# Patient Record
Sex: Female | Born: 1990 | Race: Black or African American | Hispanic: No | Marital: Single | Smoking: Never smoker
Health system: Southern US, Community
[De-identification: ages and names within clinical notes are randomized; demographics above are authoritative.]

## PROBLEM LIST (undated history)

## (undated) ENCOUNTER — Inpatient Hospital Stay (HOSPITAL_COMMUNITY): Payer: Self-pay

## (undated) DIAGNOSIS — D649 Anemia, unspecified: Secondary | ICD-10-CM

## (undated) DIAGNOSIS — B999 Unspecified infectious disease: Secondary | ICD-10-CM

## (undated) HISTORY — PX: DILATION AND CURETTAGE OF UTERUS: SHX78

---
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
## 2012-11-12 ENCOUNTER — Emergency Department (HOSPITAL_COMMUNITY)
Admission: EM | Admit: 2012-11-12 | Discharge: 2012-11-12 | Disposition: A | Discharge status: Home / Self Care | Payer: Medicaid Other | Attending: Emergency Medicine | Admitting: Emergency Medicine

## 2012-11-12 ENCOUNTER — Encounter (HOSPITAL_COMMUNITY): Payer: Self-pay | Admitting: Nurse Practitioner

## 2012-11-12 DIAGNOSIS — R5381 Other malaise: Secondary | ICD-10-CM | POA: Insufficient documentation

## 2012-11-12 DIAGNOSIS — J029 Acute pharyngitis, unspecified: Secondary | ICD-10-CM | POA: Insufficient documentation

## 2012-11-12 DIAGNOSIS — H109 Unspecified conjunctivitis: Secondary | ICD-10-CM | POA: Insufficient documentation

## 2012-11-12 LAB — RAPID STREP SCREEN (MED CTR MEBANE ONLY): Streptococcus, Group A Screen (Direct): NEGATIVE

## 2012-11-12 MED ORDER — AMOXICILLIN 500 MG PO CAPS: 500.0000 mg | ORAL_CAPSULE | Freq: Three times a day (TID) | ORAL | Status: DC

## 2012-11-12 NOTE — ED Notes (Signed)
Pt reports sore throat onset Friday, increasingly worse since onset, then today woke with redness and irritation in L eye.

## 2012-11-12 NOTE — ED Provider Notes (Signed)
Medical screening examination/treatment/procedure(s) were performed by non-physician practitioner and as supervising physician I was immediately available for consultation/collaboration.   Dagmar Hait, MD 11/12/12 [phone]

## 2012-11-12 NOTE — ED Provider Notes (Signed)
CSN: [id number]     Arrival date & time 11/12/12  1711 History  This chart was scribed for non-physician practitioner Marlon Pel, PA-C working with Dagmar Hait, MD by Danella Maiers, ED Scribe. This patient was seen in room TR04C/TR04C and the patient's care was started at 5:31 PM.    Chief Complaint  Patient presents with  . Sore Throat   Patient is a 22 y.o. female presenting with pharyngitis. The history is provided by the patient. No language interpreter was used.  Sore Throat   HPI Comments: Melinda Burch is a 22 y.o. female who presents to the Emergency Department complaining of constant, worsening sore throat onset two days ago with associated pain with swallowing. She also reports generalized weakness that started last night. She reports redness, irritation, and watery drainage to her left eye that started when she woke up yesterday morning. She denies eye pain. She states she has had similar symptoms in her eye in the past because of allergies. She states she normally gets a sore throat when she is sick. She has been eating and drinking normally. She has taken Claritin for the eye symptoms. She has not taken ibuprofen or tylenol. She denies h/o diabetes, kidney problems, hypertension. She denies fevers, nausea, emesis, diarrhea.  History reviewed. No pertinent past medical history. History reviewed. No pertinent past surgical history. History reviewed. No pertinent family history. History  Substance Use Topics  . Smoking status: Never Smoker   . Smokeless tobacco: Not on file  . Alcohol Use: Yes   OB History   Grav Para Term Preterm Abortions TAB SAB Ect Mult Living                 Review of Systems  Constitutional: Negative for fever.  HENT: Positive for sore throat.   Eyes: Positive for redness. Negative for pain.  Gastrointestinal: Negative for nausea, vomiting and diarrhea.  Neurological: Positive for weakness.  All other systems reviewed and are  negative.    Allergies  Review of patient's allergies indicates no known allergies.  Home Medications   Current Outpatient Rx  Name  Route  Sig  Dispense  Refill  . amoxicillin (AMOXIL) 500 MG capsule   Oral   Take 500 mg by mouth 2 (two) times daily. Pt took a antibiotic. Just  1 tablet. From an old rx         . amoxicillin (AMOXIL) 500 MG capsule   Oral   Take 1 capsule (500 mg total) by mouth 3 (three) times daily.   21 capsule   0    BP 128/73  Pulse 116  Temp(Src) 98.6 F (37 C) (Oral)  Resp 17  Ht 5\' 4"  (1.626 m)  Wt 145 lb (65.772 kg)  BMI 24.88 kg/m2  SpO2 100% Physical Exam  Nursing note and vitals reviewed. Constitutional: She is oriented to person, place, and time. She appears well-developed and well-nourished. No distress.  HENT:  Head: Normocephalic.  Mouth/Throat: Uvula is midline. Posterior oropharyngeal erythema present. No oropharyngeal exudate or posterior oropharyngeal edema.  + nasal congestion  Eyes: EOM are normal. Right eye exhibits no discharge. Left eye exhibits discharge (watery). Right conjunctiva has no hemorrhage. Left conjunctiva is injected.  Cardiovascular: Normal rate.   Pulmonary/Chest: Effort normal. No stridor.  Musculoskeletal: Normal range of motion.  Neurological: She is alert and oriented to person, place, and time.  Psychiatric: She has a normal mood and affect.    ED Course  Procedures (including critical care  time) Medications - No data to display  DIAGNOSTIC STUDIES: Oxygen Saturation is 100% on RA, normal by my interpretation.    COORDINATION OF CARE: 5:38 PM- Discussed treatment plan with pt which includes treatment with antibiotics and pt agrees to plan.    Labs Review Labs Reviewed  RAPID STREP SCREEN   Imaging Review No results found.  MDM   1. Conjunctivitis   2. Pharyngitis      Pt has watery discharge from eye without pain or itching. Eye not matted shut when she wakes up i the morning. I  feel this is most likely viral and have advised her to treat it symptomatically and that it will take time.  She feels as though her throat is progressively getting worse fast to where it now hurts to swallow and this is day 3. Will treat with PO abx.   22 y.o.Melinda Burch's evaluation in the Emergency Department is complete. It has been determined that no acute conditions requiring further emergency intervention are present at this time. The patient/guardian have been advised of the diagnosis and plan. We have discussed signs and symptoms that warrant return to the ED, such as changes or worsening in symptoms.  Vital signs are stable at discharge. Filed Vitals:   11/12/12 1719  BP: 128/73  Pulse: 116  Temp: 98.6 F (37 C)  Resp: 17    Patient/guardian has voiced understanding and agreed to follow-up with the PCP or specialist.  I personally performed the services described in this documentation, which was scribed in my presence. The recorded information has been reviewed and is accurate.   Dorthula Matas, PA-C 11/12/12 1751

## 2012-11-14 LAB — CULTURE, GROUP A STREP

## 2014-06-03 ENCOUNTER — Encounter (HOSPITAL_COMMUNITY): Payer: Self-pay | Admitting: Emergency Medicine

## 2014-06-03 DIAGNOSIS — O9989 Other specified diseases and conditions complicating pregnancy, childbirth and the puerperium: Secondary | ICD-10-CM | POA: Insufficient documentation

## 2014-06-03 DIAGNOSIS — O212 Late vomiting of pregnancy: Secondary | ICD-10-CM | POA: Insufficient documentation

## 2014-06-03 DIAGNOSIS — R5383 Other fatigue: Secondary | ICD-10-CM | POA: Insufficient documentation

## 2014-06-03 DIAGNOSIS — Z3A00 Weeks of gestation of pregnancy not specified: Secondary | ICD-10-CM | POA: Insufficient documentation

## 2014-06-03 DIAGNOSIS — Z792 Long term (current) use of antibiotics: Secondary | ICD-10-CM | POA: Insufficient documentation

## 2014-06-03 LAB — CBC WITH DIFFERENTIAL/PLATELET
Basophils Absolute: 0 10*3/uL (ref 0.0–0.1)
Basophils Relative: 0 % (ref 0–1)
Eosinophils Absolute: 0.1 10*3/uL (ref 0.0–0.7)
Eosinophils Relative: 1 % (ref 0–5)
HCT: 35.5 % — ABNORMAL LOW (ref 36.0–46.0)
Hemoglobin: 12.3 g/dL (ref 12.0–15.0)
LYMPHS ABS: 1.3 10*3/uL (ref 0.7–4.0)
LYMPHS PCT: 20 % (ref 12–46)
MCH: 28.8 pg (ref 26.0–34.0)
MCHC: 34.6 g/dL (ref 30.0–36.0)
MCV: 83.1 fL (ref 78.0–100.0)
Monocytes Absolute: 0.4 10*3/uL (ref 0.1–1.0)
Monocytes Relative: 6 % (ref 3–12)
NEUTROS ABS: 5 10*3/uL (ref 1.7–7.7)
NEUTROS PCT: 73 % (ref 43–77)
PLATELETS: 309 10*3/uL (ref 150–400)
RBC: 4.27 MIL/uL (ref 3.87–5.11)
RDW: 13.8 % (ref 11.5–15.5)
WBC: 6.8 10*3/uL (ref 4.0–10.5)

## 2014-06-03 LAB — COMPREHENSIVE METABOLIC PANEL
ALT: 11 U/L (ref 0–35)
ANION GAP: 9 (ref 5–15)
AST: 16 U/L (ref 0–37)
Albumin: 4.1 g/dL (ref 3.5–5.2)
Alkaline Phosphatase: 38 U/L — ABNORMAL LOW (ref 39–117)
BUN: 6 mg/dL (ref 6–23)
CALCIUM: 9.3 mg/dL (ref 8.4–10.5)
CO2: 26 mmol/L (ref 19–32)
CREATININE: 0.75 mg/dL (ref 0.50–1.10)
Chloride: 100 mmol/L (ref 96–112)
GLUCOSE: 98 mg/dL (ref 70–99)
POTASSIUM: 3.5 mmol/L (ref 3.5–5.1)
SODIUM: 135 mmol/L (ref 135–145)
TOTAL PROTEIN: 7.7 g/dL (ref 6.0–8.3)
Total Bilirubin: 0.8 mg/dL (ref 0.3–1.2)

## 2014-06-03 LAB — URINALYSIS, ROUTINE W REFLEX MICROSCOPIC
GLUCOSE, UA: NEGATIVE mg/dL
Hgb urine dipstick: NEGATIVE
Ketones, ur: 40 mg/dL — AB
NITRITE: NEGATIVE
Protein, ur: NEGATIVE mg/dL
Specific Gravity, Urine: 1.02 (ref 1.005–1.030)
UROBILINOGEN UA: 1 mg/dL (ref 0.0–1.0)
pH: 6 (ref 5.0–8.0)

## 2014-06-03 LAB — URINE MICROSCOPIC-ADD ON

## 2014-06-03 LAB — POC URINE PREG, ED: PREG TEST UR: POSITIVE — AB

## 2014-06-03 NOTE — ED Notes (Signed)
Pt. reports nausea and vomitting onset 3 days ago with fatigue , denies diarrhea . No fever or chills.

## 2014-06-04 ENCOUNTER — Emergency Department (HOSPITAL_COMMUNITY)
Admission: EM | Admit: 2014-06-04 | Discharge: 2014-06-04 | Disposition: A | Discharge status: Home / Self Care | Payer: Medicaid Other | Attending: Emergency Medicine | Admitting: Emergency Medicine

## 2014-06-04 DIAGNOSIS — R112 Nausea with vomiting, unspecified: Secondary | ICD-10-CM

## 2014-06-04 DIAGNOSIS — Z3490 Encounter for supervision of normal pregnancy, unspecified, unspecified trimester: Secondary | ICD-10-CM

## 2014-06-04 MED ORDER — ONDANSETRON HCL 8 MG PO TABS: 8.0000 mg | ORAL_TABLET | Freq: Three times a day (TID) | ORAL | Status: DC | PRN

## 2014-06-04 MED ORDER — PROMETHAZINE HCL 25 MG PO TABS: 25.0000 mg | ORAL_TABLET | Freq: Four times a day (QID) | ORAL | Status: DC | PRN

## 2014-06-04 MED ORDER — ONDANSETRON 4 MG PO TBDP
8.0000 mg | ORAL_TABLET | Freq: Once | ORAL | Status: AC
Start: 2014-06-04 — End: 2014-06-04
  Administered 2014-06-04: 8 mg via ORAL
  Filled 2014-06-04: qty 2

## 2014-06-04 NOTE — ED Provider Notes (Signed)
CSN: [id number]     Arrival date & time 06/03/14  2105 History  This chart was scribed for [name], MD by Rayfield Citizen, ED Scribe. This patient was seen in room D35C/D35C and the patient's care was started at 1:15 AM.    Chief Complaint  Patient presents with  . Emesis   Patient is a 24 y.o. female presenting with vomiting. The history is provided by the patient. No language interpreter was used.  Emesis Severity:  Moderate Duration:  3 days Timing:  Intermittent Quality:  Stomach contents Progression:  Unchanged Chronicity:  New Recent urination:  Normal Context: not post-tussive and not self-induced   Relieved by:  Nothing Worsened by:  Nothing tried Ineffective treatments: Pepto-Bismol. Associated symptoms: no abdominal pain, no chills, no diarrhea and no fever   Risk factors: pregnant now   Risk factors: no prior abdominal surgery      HPI Comments: Melinda Burch is a 24 y.o. female who presents to the Emergency Department complaining of 3 days of nausea, vomiting with fatigue. She explains she took some Pepto Bismol but vomited just after. She denies abdominal pain, diarrhea, fever, chills. She has never been pregnant before; she denies any prior abdominal surgeries.   LNMP in March; she is unsure of the date.   PMH - none Soc hc - social ETOH usage  History  Substance Use Topics  . Smoking status: Never Smoker   . Smokeless tobacco: Not on file  . Alcohol Use: Yes   OB History    No data available     Review of Systems  Constitutional: Positive for fatigue. Negative for fever and chills.  Gastrointestinal: Positive for nausea and vomiting. Negative for abdominal pain and diarrhea.  Genitourinary: Negative for vaginal bleeding.  All other systems reviewed and are negative.  Allergies  Review of patient's allergies indicates no known allergies.  Home Medications   Prior to Admission medications   Medication Sig Start Date End Date Taking? Authorizing  Provider  bismuth subsalicylate (PEPTO BISMOL) 262 MG/15ML suspension Take 30 mLs by mouth every 6 (six) hours as needed.   Yes Historical Provider, MD  amoxicillin (AMOXIL) 500 MG capsule Take 1 capsule (500 mg total) by mouth 3 (three) times daily. Patient not taking: Reported on 06/03/2014 11/12/12   Delos Haring, PA-C   BP 115/73 mmHg  Pulse 88  Temp(Src) 98.9 F (37.2 C) (Oral)  Resp 18  Ht 5\' 4"  (1.626 m)  Wt 135 lb (61.236 kg)  BMI 23.16 kg/m2  SpO2 100%  LMP  Physical Exam  Nursing note and vitals reviewed.    CONSTITUTIONAL: Well developed/well nourished HEAD: Normocephalic/atraumatic EYES: EOMI/PERRL ENMT: Mucous membranes dry NECK: supple no meningeal signs SPINE/BACK:entire spine nontender CV: S1/S2 noted, no murmurs/rubs/gallops noted LUNGS: Lungs are clear to auscultation bilaterally, no apparent distress ABDOMEN: soft, nontender, no rebound or guarding, bowel sounds noted throughout abdomen GU:no cva tenderness NEURO: Pt is awake/alert/appropriate, moves all extremitiesx4.  No facial droop.   EXTREMITIES: pulses normal/equal, full ROM SKIN: warm, color normal PSYCH: no abnormalities of mood noted, alert and oriented to situation   ED Course  Procedures   DIAGNOSTIC STUDIES: Oxygen Saturation is 100% on RA, normal by my interpretation.    COORDINATION OF CARE: 1:19 AM Discussed treatment plan with pt at bedside and pt agreed to plan.  Pt taking PO No abd pain reported Discussed need to cut out ETOH Given info for OBGYN f/u I don't feel emergent imaging required Pt gave  permission to speak to family in room Labs Review Labs Reviewed  CBC WITH DIFFERENTIAL/PLATELET - Abnormal; Notable for the following:    HCT 35.5 (*)    All other components within normal limits  COMPREHENSIVE METABOLIC PANEL - Abnormal; Notable for the following:    Alkaline Phosphatase 38 (*)    All other components within normal limits  URINALYSIS, ROUTINE W REFLEX MICROSCOPIC  - Abnormal; Notable for the following:    Color, Urine AMBER (*)    APPearance CLOUDY (*)    Bilirubin Urine SMALL (*)    Ketones, ur 40 (*)    Leukocytes, UA SMALL (*)    All other components within normal limits  URINE MICROSCOPIC-ADD ON - Abnormal; Notable for the following:    Squamous Epithelial / LPF MANY (*)    Bacteria, UA FEW (*)    All other components within normal limits  POC URINE PREG, ED - Abnormal; Notable for the following:    Preg Test, Ur POSITIVE (*)    All other components within normal limits   Medications  ondansetron (ZOFRAN-ODT) disintegrating tablet 8 mg (8 mg Oral Given 06/04/14 0125)     MDM   Final diagnoses:  Pregnancy  Non-intractable vomiting with nausea, vomiting of unspecified type    Nursing notes including past medical history and social history reviewed and considered in documentation Labs/vital reviewed myself and considered during evaluation   I personally performed the services described in this documentation, which was scribed in my presence. The recorded information has been reviewed and is accurate.       [name], MD 06/04/14 [phone]

## 2014-06-04 NOTE — ED Notes (Signed)
Patient is resting comfortably. 

## 2014-06-04 NOTE — ED Notes (Signed)
Family at bedside. 

## 2015-02-16 NOTE — L&D Delivery Note (Signed)
Patient is 25 y.o. G2P0010 [redacted]w[redacted]d admitted for PROM, hx of Eczema and PMHx of chlamydia during 2nd trimester    Delivery Note At 1:11 PM a viable female was delivered via Vaginal, Spontaneous Delivery (Presentation: LOA).  APGAR: 8,9 ; weight pending.   Placenta status: intact.  Cord: 3 vessel. Without complications.  Anesthesia:  none Episiotomy:  none Lacerations: 1st degree;Periurethral Suture Repair: none Est. Blood Loss (mL): 100  Mom to postpartum.  Baby to Couplet care / Skin to Skin.  Bufford Lope 10/14/2015, 1:21 PM      Upon arrival patient was complete and pushing. She pushed with good maternal effort to deliver a healthy baby boy. Baby delivered without difficulty, was noted to have good tone and place on maternal abdomen for oral suctioning, drying and stimulation. Delayed cord clamping performed. Placenta delivered intact with 3V cord. Vaginal canal and perineum was inspected and no repairs deemed necessary; hemostatic. Pitocin was started and uterus massaged until bleeding slowed. Counts of sharps, instruments, and lap pads were all correct.   Bufford Lope, DO PGY-1 8/29/20171:21 PM

## 2015-02-18 ENCOUNTER — Encounter (HOSPITAL_COMMUNITY): Payer: Self-pay | Admitting: Emergency Medicine

## 2015-02-18 ENCOUNTER — Emergency Department (HOSPITAL_COMMUNITY)
Admission: EM | Admit: 2015-02-18 | Discharge: 2015-02-19 | Disposition: A | Discharge status: Home / Self Care | Payer: Medicaid Other | Attending: Emergency Medicine | Admitting: Emergency Medicine

## 2015-02-18 DIAGNOSIS — Z3A22 22 weeks gestation of pregnancy: Secondary | ICD-10-CM | POA: Insufficient documentation

## 2015-02-18 DIAGNOSIS — O210 Mild hyperemesis gravidarum: Secondary | ICD-10-CM | POA: Insufficient documentation

## 2015-02-18 DIAGNOSIS — O219 Vomiting of pregnancy, unspecified: Secondary | ICD-10-CM

## 2015-02-18 LAB — CBC
HCT: 37.3 % (ref 36.0–46.0)
Hemoglobin: 13 g/dL (ref 12.0–15.0)
MCH: 29.3 pg (ref 26.0–34.0)
MCHC: 34.9 g/dL (ref 30.0–36.0)
MCV: 84.2 fL (ref 78.0–100.0)
PLATELETS: 314 10*3/uL (ref 150–400)
RBC: 4.43 MIL/uL (ref 3.87–5.11)
RDW: 13.3 % (ref 11.5–15.5)
WBC: 12.5 10*3/uL — AB (ref 4.0–10.5)

## 2015-02-18 LAB — COMPREHENSIVE METABOLIC PANEL
ALK PHOS: 35 U/L — AB (ref 38–126)
ALT: 14 U/L (ref 14–54)
AST: 20 U/L (ref 15–41)
Albumin: 4.4 g/dL (ref 3.5–5.0)
Anion gap: 13 (ref 5–15)
BILIRUBIN TOTAL: 0.7 mg/dL (ref 0.3–1.2)
BUN: 8 mg/dL (ref 6–20)
CALCIUM: 10.1 mg/dL (ref 8.9–10.3)
CO2: 22 mmol/L (ref 22–32)
CREATININE: 0.63 mg/dL (ref 0.44–1.00)
Chloride: 104 mmol/L (ref 101–111)
Glucose, Bld: 111 mg/dL — ABNORMAL HIGH (ref 65–99)
Potassium: 4.2 mmol/L (ref 3.5–5.1)
Sodium: 139 mmol/L (ref 135–145)
TOTAL PROTEIN: 7.9 g/dL (ref 6.5–8.1)

## 2015-02-18 LAB — I-STAT BETA HCG BLOOD, ED (MC, WL, AP ONLY)

## 2015-02-18 LAB — URINALYSIS, ROUTINE W REFLEX MICROSCOPIC
Bilirubin Urine: NEGATIVE
Glucose, UA: NEGATIVE mg/dL
HGB URINE DIPSTICK: NEGATIVE
Ketones, ur: 15 mg/dL — AB
LEUKOCYTES UA: NEGATIVE
Nitrite: NEGATIVE
PROTEIN: NEGATIVE mg/dL
Specific Gravity, Urine: 1.019 (ref 1.005–1.030)
pH: 6 (ref 5.0–8.0)

## 2015-02-18 MED ORDER — ONDANSETRON 4 MG PO TBDP
ORAL_TABLET | ORAL | Status: AC
  Filled 2015-02-18: qty 1

## 2015-02-18 MED ORDER — ONDANSETRON 4 MG PO TBDP
4.0000 mg | ORAL_TABLET | Freq: Once | ORAL | Status: AC | PRN
  Administered 2015-02-18: 4 mg via ORAL

## 2015-02-18 NOTE — ED Notes (Signed)
Dr. Oni at the bedside.  

## 2015-02-18 NOTE — ED Provider Notes (Signed)
CSN: [id number]   Arrival date & time 02/18/15  1739 History  By signing my name below, I, Melinda Burch, attest that this documentation has been prepared under the direction and in the presence of Everlene Balls, MD . Electronically Signed: Evelene Burch, Scribe. 02/18/2015. 11:37 PM.    Chief Complaint  Patient presents with  . Nausea   The history is provided by the patient. No language interpreter was used.     HPI Comments:  Melinda Burch is a 25 y.o. female who presents to the Emergency Department complaining of moderate nausea with associated vomiting for ~ 2 days. Pt is currently pregnant; she is unsure how far along she is. She notes symptoms today are similar to past pregnancy. Pt deneis vaginal bleeding/discharge. No alleviating factors noted.   History reviewed. No pertinent past medical history. History reviewed. No pertinent past surgical history. No family history on file. Social History  Substance Use Topics  . Smoking status: Never Smoker   . Smokeless tobacco: None  . Alcohol Use: Yes   OB History    No data available     Review of Systems  10 systems reviewed and all are negative for acute change except as noted in the HPI.  Allergies  Review of patient's allergies indicates no known allergies.  Home Medications   Prior to Admission medications   Medication Sig Start Date End Date Taking? Authorizing Provider  ondansetron (ZOFRAN) 8 MG tablet Take 1 tablet (8 mg total) by mouth every 8 (eight) hours as needed for nausea. 06/04/14   [name], MD  promethazine (PHENERGAN) 25 MG tablet Take 1 tablet (25 mg total) by mouth every 6 (six) hours as needed for nausea or vomiting. 06/04/14   [name], MD   BP 122/73 mmHg  Pulse 100  Temp(Src) 99.1 F (37.3 C) (Oral)  Resp 16  Ht 5\' 4"  (1.626 m)  Wt 135 lb (61.236 kg)  BMI 23.16 kg/m2  SpO2 100%  LMP  (LMP Unknown) Physical Exam  Constitutional: She is oriented to person, place, and time. She  appears well-developed and well-nourished. No distress.  HENT:  Head: Normocephalic and atraumatic.  Nose: Nose normal.  Mouth/Throat: Oropharynx is clear and moist. No oropharyngeal exudate.  Eyes: Conjunctivae and EOM are normal. Pupils are equal, round, and reactive to light. No scleral icterus.  Neck: Normal range of motion. Neck supple. No JVD present. No tracheal deviation present. No thyromegaly present.  Cardiovascular: Normal rate, regular rhythm and normal heart sounds.  Exam reveals no gallop and no friction rub.   No murmur heard. Pulmonary/Chest: Effort normal and breath sounds normal. No respiratory distress. She has no wheezes. She exhibits no tenderness.  Abdominal: Soft. Bowel sounds are normal. She exhibits no distension and no mass. There is no tenderness. There is no rebound and no guarding.  Musculoskeletal: Normal range of motion. She exhibits no edema or tenderness.  Lymphadenopathy:    She has no cervical adenopathy.  Neurological: She is alert and oriented to person, place, and time. No cranial nerve deficit. She exhibits normal muscle tone.  Skin: Skin is warm and dry. No rash noted. No erythema. No pallor.  Nursing note and vitals reviewed.   ED Course  Procedures   DIAGNOSTIC STUDIES:  Oxygen Saturation is 100% on RA, normal by my interpretation.    COORDINATION OF CARE:  11:38 PM Discussed treatment plan with pt at bedside and pt agreed to plan.  Labs Review Labs Reviewed  COMPREHENSIVE METABOLIC  PANEL - Abnormal; Notable for the following:    Glucose, Bld 111 (*)    Alkaline Phosphatase 35 (*)    All other components within normal limits  CBC - Abnormal; Notable for the following:    WBC 12.5 (*)    All other components within normal limits  URINALYSIS, ROUTINE W REFLEX MICROSCOPIC (NOT AT [facility]) - Abnormal; Notable for the following:    APPearance CLOUDY (*)    Ketones, ur 15 (*)    All other components within normal limits  I-STAT BETA HCG  BLOOD, ED ([facility], [facility], [facility] ONLY) - Abnormal; Notable for the following:    I-stat hCG, quantitative >2000.0 (*)    All other components within normal limits    Imaging Review No results found. I have personally reviewed and evaluated these images and lab results as part of my medical decision-making.   EKG Interpretation None      MDM   Final diagnoses:  None    Patient presents  to the emergency department for nausea and vomiting in the setting of pregnancy. Education was provided. Zofran did not work in triage, will discharge with prescription for Reglan. OB/GYN follow-up was advised within 3 days. She denies any abdominal pain or vaginal bleeding. She appears well in no acute distress, vital signs were within her normal limits and she is safe for discharge.    I personally performed the services described in this documentation, which was scribed in my presence. The recorded information has been reviewed and is accurate.     Everlene Balls, MD 02/19/15 0002

## 2015-02-18 NOTE — ED Notes (Signed)
Pt states she had a positive pregnancy test 3 weeks ago. Pt states she doesn't remember when last menstrual period was. Pt states she has been unable to have any prenatal care. For the last two days has been very nauseous and unable to keep any foods . Pt drinking ginger ale in triage.

## 2015-02-19 MED ORDER — METOCLOPRAMIDE HCL 10 MG PO TABS
10.0000 mg | ORAL_TABLET | Freq: Once | ORAL | Status: AC
  Administered 2015-02-19: 10 mg via ORAL
  Filled 2015-02-19: qty 1

## 2015-02-19 MED ORDER — METOCLOPRAMIDE HCL 10 MG PO TABS: 10.0000 mg | ORAL_TABLET | Freq: Three times a day (TID) | ORAL | Status: DC | PRN

## 2015-02-19 NOTE — Discharge Instructions (Signed)
Morning Sickness Melinda Burch, your sicknesses likely cause from pregnancy. Take Reglan as needed for your nausea. See an OB/GYN physician within 3 days for prenatal care. If any symptoms worsen come back to the emergency department immediately. Thank you. Morning sickness is when you feel sick to your stomach (nauseous) during pregnancy. You may feel sick to your stomach and throw up (vomit). You may feel sick in the morning, but you can feel this way any time of day. Some women feel very sick to their stomach and cannot stop throwing up (hyperemesis gravidarum). HOME CARE  Only take medicines as told by your doctor.  Take multivitamins as told by your doctor. Taking multivitamins before getting pregnant can stop or lessen the harshness of morning sickness.  Eat dry toast or unsalted crackers before getting out of bed.  Eat 5 to 6 small meals a day.  Eat dry and bland foods like rice and baked potatoes.  Do not drink liquids with meals. Drink between meals.  Do not eat greasy, fatty, or spicy foods.  Have someone cook for you if the smell of food causes you to feel sick or throw up.  If you feel sick to your stomach after taking prenatal vitamins, take them at night or with a snack.  Eat protein when you need a snack (nuts, yogurt, cheese).  Eat unsweetened gelatins for dessert.  Wear a bracelet used for sea sickness (acupressure wristband).  Go to a doctor that puts thin needles into certain body points (acupuncture) to improve how you feel.  Do not smoke.  Use a humidifier to keep the air in your house free of odors.  Get lots of fresh air. GET HELP IF:  You need medicine to feel better.  You feel dizzy or lightheaded.  You are losing weight. GET HELP RIGHT AWAY IF:   You feel very sick to your stomach and cannot stop throwing up.  You pass out (faint). MAKE SURE YOU:  Understand these instructions.  Will watch your condition.  Will get help right away if you  are not doing well or get worse.   This information is not intended to replace advice given to you by your health care provider. Make sure you discuss any questions you have with your health care provider.   Document Released: 03/11/2004 Document Revised: 02/22/2014 Document Reviewed: 07/19/2012 Elsevier Interactive Patient Education 2016 [place] of Pregnancy The first trimester of pregnancy is from week 1 until the end of week 12 (months 1 through 3). During this time, your baby will begin to develop inside you. At 6-8 weeks, the eyes and face are formed, and the heartbeat can be seen on ultrasound. At the end of 12 weeks, all the baby's organs are formed. Prenatal care is all the medical care you receive before the birth of your baby. Make sure you get good prenatal care and follow all of your doctor's instructions. HOME CARE  Medicines  Take medicine only as told by your doctor. Some medicines are safe and some are not during pregnancy.  Take your prenatal vitamins as told by your doctor.  Take medicine that helps you poop (stool softener) as needed if your doctor says it is okay. Diet  Eat regular, healthy meals.  Your doctor will tell you the amount of weight gain that is right for you.  Avoid raw meat and uncooked cheese.  If you feel sick to your stomach (nauseous) or throw up (vomit):  Eat 4 or  5 small meals a day instead of 3 large meals.  Try eating a few soda crackers.  Drink liquids between meals instead of during meals.  If you have a hard time pooping (constipation):  Eat high-fiber foods like fresh vegetables, fruit, and whole grains.  Drink enough fluids to keep your pee (urine) clear or pale yellow. Activity and Exercise  Exercise only as told by your doctor. Stop exercising if you have cramps or pain in your lower belly (abdomen) or low back.  Try to avoid standing for long periods of time. Move your legs often if you must stand in one  place for a long time.  Avoid heavy lifting.  Wear low-heeled shoes. Sit and stand up straight.  You can have sex unless your doctor tells you not to. Relief of Pain or Discomfort  Wear a good support bra if your breasts are sore.  Take warm water baths (sitz baths) to soothe pain or discomfort caused by hemorrhoids. Use hemorrhoid cream if your doctor says it is okay.  Rest with your legs raised if you have leg cramps or low back pain.  Wear support hose if you have puffy, bulging veins (varicose veins) in your legs. Raise (elevate) your feet for 15 minutes, 3-4 times a day. Limit salt in your diet. Prenatal Care  Schedule your prenatal visits by the twelfth week of pregnancy.  Write down your questions. Take them to your prenatal visits.  Keep all your prenatal visits as told by your doctor. Safety  Wear your seat belt at all times when driving.  Make a list of emergency phone numbers. The list should include numbers for family, friends, the hospital, and police and fire departments. General Tips  Ask your doctor for a referral to a local prenatal class. Begin classes no later than at the start of month 6 of your pregnancy.  Ask for help if you need counseling or help with nutrition. Your doctor can give you advice or tell you where to go for help.  Do not use hot tubs, steam rooms, or saunas.  Do not douche or use tampons or scented sanitary pads.  Do not cross your legs for long periods of time.  Avoid litter boxes and soil used by cats.  Avoid all smoking, herbs, and alcohol. Avoid drugs not approved by your doctor.  Do not use any tobacco products, including cigarettes, chewing tobacco, and electronic cigarettes. If you need help quitting, ask your doctor. You may get counseling or other support to help you quit.  Visit your dentist. At home, brush your teeth with a soft toothbrush. Be gentle when you floss. GET HELP IF:  You are dizzy.  You have mild cramps  or pressure in your lower belly.  You have a nagging pain in your belly area.  You continue to feel sick to your stomach, throw up, or have watery poop (diarrhea).  You have a bad smelling fluid coming from your vagina.  You have pain with peeing (urination).  You have increased puffiness (swelling) in your face, hands, legs, or ankles. GET HELP RIGHT AWAY IF:   You have a fever.  You are leaking fluid from your vagina.  You have spotting or bleeding from your vagina.  You have very bad belly cramping or pain.  You gain or lose weight rapidly.  You throw up blood. It may look like coffee grounds.  You are around people who have [place] measles, fifth disease, or chickenpox.  You have  a very bad headache.  You have shortness of breath.  You have any kind of trauma, such as from a fall or a car accident.   This information is not intended to replace advice given to you by your health care provider. Make sure you discuss any questions you have with your health care provider.   Document Released: 07/21/2007 Document Revised: 02/22/2014 Document Reviewed: 12/12/2012 Elsevier Interactive Patient Education [employer].

## 2015-02-19 NOTE — ED Notes (Signed)
Pt asking about motion sickness medication in pregnancy. Dr. Claudine Mouton looked up dramamine and this RN informed pt it was safe to take OTC dramamine for motion sickness.

## 2015-02-20 ENCOUNTER — Telehealth: Payer: Self-pay | Admitting: *Deleted

## 2015-02-20 DIAGNOSIS — Z3201 Encounter for pregnancy test, result positive: Secondary | ICD-10-CM

## 2015-02-20 NOTE — Telephone Encounter (Signed)
Patient called and stated that she has been referred to [place] for nausea. She took her Reglan this morning and vomited it up. Can she take another one? Advised patient ok to take more reglan. Also scheduled her for dating viability ultrasound.

## 2015-02-27 ENCOUNTER — Ambulatory Visit (HOSPITAL_COMMUNITY): Admission: RE | Admit: 2015-02-27 | Payer: Medicaid Other | Source: Ambulatory Visit

## 2015-03-04 ENCOUNTER — Ambulatory Visit (HOSPITAL_COMMUNITY)
Admission: RE | Admit: 2015-03-04 | Discharge: 2015-03-04 | Disposition: A | Discharge status: Home / Self Care | Payer: Self-pay | Source: Ambulatory Visit | Attending: Family Medicine | Admitting: Family Medicine

## 2015-03-04 DIAGNOSIS — Z36 Encounter for antenatal screening of mother: Secondary | ICD-10-CM | POA: Insufficient documentation

## 2015-03-04 DIAGNOSIS — O208 Other hemorrhage in early pregnancy: Secondary | ICD-10-CM | POA: Insufficient documentation

## 2015-03-04 DIAGNOSIS — Z3A08 8 weeks gestation of pregnancy: Secondary | ICD-10-CM | POA: Insufficient documentation

## 2015-03-04 DIAGNOSIS — Z3201 Encounter for pregnancy test, result positive: Secondary | ICD-10-CM

## 2015-03-05 ENCOUNTER — Telehealth: Payer: Self-pay

## 2015-03-05 NOTE — Telephone Encounter (Signed)
Per Dr.Stinson patient Melinda Burch was normal and she should begin prenatal care. I called patient but cell phone is not receiving messages at this time.

## 2015-03-06 ENCOUNTER — Encounter: Payer: Self-pay | Admitting: *Deleted

## 2015-03-06 NOTE — Telephone Encounter (Signed)
Attempted to call patient again. The phone went straight to voice mail and a message stated the vm had not been set up. Will send her a letter. Letter sent.

## 2015-05-17 ENCOUNTER — Encounter (HOSPITAL_COMMUNITY): Payer: Self-pay | Admitting: Physical Medicine and Rehabilitation

## 2015-05-17 ENCOUNTER — Emergency Department (HOSPITAL_COMMUNITY)
Admission: EM | Admit: 2015-05-17 | Discharge: 2015-05-17 | Disposition: A | Discharge status: Home / Self Care | Payer: Medicaid Other | Attending: Emergency Medicine | Admitting: Emergency Medicine

## 2015-05-17 DIAGNOSIS — Y92410 Unspecified street and highway as the place of occurrence of the external cause: Secondary | ICD-10-CM | POA: Insufficient documentation

## 2015-05-17 DIAGNOSIS — Y998 Other external cause status: Secondary | ICD-10-CM | POA: Diagnosis not present

## 2015-05-17 DIAGNOSIS — S0990XA Unspecified injury of head, initial encounter: Secondary | ICD-10-CM | POA: Diagnosis not present

## 2015-05-17 DIAGNOSIS — Z3A20 20 weeks gestation of pregnancy: Secondary | ICD-10-CM | POA: Diagnosis not present

## 2015-05-17 DIAGNOSIS — Y9389 Activity, other specified: Secondary | ICD-10-CM | POA: Insufficient documentation

## 2015-05-17 DIAGNOSIS — O9A212 Injury, poisoning and certain other consequences of external causes complicating pregnancy, second trimester: Secondary | ICD-10-CM | POA: Diagnosis not present

## 2015-05-17 MED ORDER — ACETAMINOPHEN 325 MG PO TABS
650.0000 mg | ORAL_TABLET | Freq: Once | ORAL | Status: AC
  Administered 2015-05-17: 650 mg via ORAL
  Filled 2015-05-17: qty 2

## 2015-05-17 NOTE — Discharge Instructions (Signed)
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
## 2015-05-17 NOTE — ED Notes (Signed)
Unable to find fetal heart rate with hand held doppler.  Please see providers note and [place] assessment.

## 2015-05-17 NOTE — ED Notes (Signed)
Unable to obtain fetal heart rate, will have a second nurse attempt to obtain fetal HR with doppler
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
## 2015-06-01 ENCOUNTER — Encounter (HOSPITAL_COMMUNITY): Payer: Self-pay

## 2015-06-01 ENCOUNTER — Inpatient Hospital Stay (HOSPITAL_COMMUNITY)
Admission: AD | Admit: 2015-06-01 | Discharge: 2015-06-01 | Disposition: A | Discharge status: Home / Self Care | Payer: Medicaid Other | Source: Ambulatory Visit | Attending: Obstetrics & Gynecology | Admitting: Obstetrics & Gynecology

## 2015-06-01 DIAGNOSIS — R042 Hemoptysis: Secondary | ICD-10-CM | POA: Insufficient documentation

## 2015-06-01 DIAGNOSIS — O26892 Other specified pregnancy related conditions, second trimester: Secondary | ICD-10-CM | POA: Insufficient documentation

## 2015-06-01 DIAGNOSIS — J302 Other seasonal allergic rhinitis: Secondary | ICD-10-CM | POA: Diagnosis not present

## 2015-06-01 DIAGNOSIS — Z3A21 21 weeks gestation of pregnancy: Secondary | ICD-10-CM | POA: Insufficient documentation

## 2015-06-01 DIAGNOSIS — Z1389 Encounter for screening for other disorder: Secondary | ICD-10-CM

## 2015-06-01 LAB — URINALYSIS, ROUTINE W REFLEX MICROSCOPIC
BILIRUBIN URINE: NEGATIVE
GLUCOSE, UA: NEGATIVE mg/dL
HGB URINE DIPSTICK: NEGATIVE
KETONES UR: NEGATIVE mg/dL
Nitrite: NEGATIVE
PH: 5.5 (ref 5.0–8.0)
Protein, ur: NEGATIVE mg/dL
SPECIFIC GRAVITY, URINE: 1.025 (ref 1.005–1.030)

## 2015-06-01 LAB — URINE MICROSCOPIC-ADD ON: RBC / HPF: NONE SEEN RBC/hpf (ref 0–5)

## 2015-06-01 LAB — CBC
HEMATOCRIT: 32 % — AB (ref 36.0–46.0)
Hemoglobin: 11.3 g/dL — ABNORMAL LOW (ref 12.0–15.0)
MCH: 30.6 pg (ref 26.0–34.0)
MCHC: 35.3 g/dL (ref 30.0–36.0)
MCV: 86.7 fL (ref 78.0–100.0)
Platelets: 292 10*3/uL (ref 150–400)
RBC: 3.69 MIL/uL — ABNORMAL LOW (ref 3.87–5.11)
RDW: 13.3 % (ref 11.5–15.5)
WBC: 8.3 10*3/uL (ref 4.0–10.5)

## 2015-06-01 MED ORDER — CETIRIZINE HCL 10 MG PO CAPS: 10.0000 mg | ORAL_CAPSULE | Freq: Every day | ORAL | Status: DC

## 2015-06-01 NOTE — MAU Note (Signed)
Noted blood in emesis. Small clot- bright red. No pain .  Has had a cough with a lot of mucous

## 2015-06-01 NOTE — MAU Provider Note (Signed)
History   [id number]   Chief Complaint  Patient presents with  . Hemoptysis    HPI Melinda Burch is a 25 y.o. female  G1P0 at [redacted]w[redacted]d IUP here with report of blood in vomitus x 2.  Small grape sized clot.  Increased coughing and mucus for past two weeks.  Denies any pain in throat of epigastric region.  Denies fever, body aches, or chills.    No LMP recorded (lmp unknown). Patient is pregnant.  OB History  Gravida Para Term Preterm AB SAB TAB Ectopic Multiple Living  1             # Outcome Date GA Lbr Len/2nd Weight Sex Delivery Anes PTL Lv  1 Current               No past medical history on file.  History reviewed. No pertinent family history.  Social History   Social History  . Marital Status: Married    Spouse Name: N/A  . Number of Children: N/A  . Years of Education: N/A   Social History Main Topics  . Smoking status: Never Smoker   . Smokeless tobacco: None  . Alcohol Use: No  . Drug Use: No  . Sexual Activity: Yes   Other Topics Concern  . None   Social History Narrative    No Known Allergies  No current facility-administered medications on file prior to encounter.   Current Outpatient Prescriptions on File Prior to Encounter  Medication Sig Dispense Refill  . metoCLOPramide (REGLAN) 10 MG tablet Take 1 tablet (10 mg total) by mouth every 8 (eight) hours as needed for nausea or vomiting. 12 tablet 0  . [DISCONTINUED] promethazine (PHENERGAN) 25 MG tablet Take 1 tablet (25 mg total) by mouth every 6 (six) hours as needed for nausea or vomiting. (Patient not taking: Reported on 02/18/2015) 15 tablet 0     Review of Systems  Constitutional: Negative for fever and chills.  HENT: Positive for postnasal drip and rhinorrhea. Negative for ear pain, nosebleeds, sneezing, sore throat, trouble swallowing and voice change.   Respiratory: Positive for cough.   Gastrointestinal: Positive for nausea and vomiting. Negative for constipation.  Genitourinary:  Negative for vaginal bleeding, vaginal pain and pelvic pain.  All other systems reviewed and are negative.    Physical Exam   Filed Vitals:   06/01/15 1344  BP: 109/80  Pulse: 99  Temp: 98.3 F (36.8 C)  TempSrc: Oral  Resp: 16  Weight: 129 lb 6.4 oz (58.695 kg)    Physical Exam  Constitutional: She is oriented to person, place, and time. She appears well-developed and well-nourished. No distress.  HENT:  Head: Normocephalic.  Mouth/Throat: No uvula swelling. No tonsillar abscesses.  Allergy shiners; cobblestoning seen; no blood seen  Neck: Normal range of motion. Neck supple.  Cardiovascular: Normal rate, regular rhythm and normal heart sounds.   Respiratory: Effort normal and breath sounds normal. No respiratory distress.  GI: Soft. There is no tenderness.  Genitourinary: Guaiac stool:    No bleeding in the vagina.  Musculoskeletal: Normal range of motion. She exhibits no edema.  Neurological: She is alert and oriented to person, place, and time.  Skin: Skin is warm and dry.    MAU Course  Procedures  MDM Results for orders placed or performed during the hospital encounter of 06/01/15 (from the past 24 hour(s))  Urinalysis, Routine w reflex microscopic (not at [facility])     Status: Abnormal   Collection Time: 06/01/15  1:45 PM  Result Value Ref Range   Color, Urine YELLOW YELLOW   APPearance CLEAR CLEAR   Specific Gravity, Urine 1.025 1.005 - 1.030   pH 5.5 5.0 - 8.0   Glucose, UA NEGATIVE NEGATIVE mg/dL   Hgb urine dipstick NEGATIVE NEGATIVE   Bilirubin Urine NEGATIVE NEGATIVE   Ketones, ur NEGATIVE NEGATIVE mg/dL   Protein, ur NEGATIVE NEGATIVE mg/dL   Nitrite NEGATIVE NEGATIVE   Leukocytes, UA TRACE (A) NEGATIVE  Urine microscopic-add on     Status: Abnormal   Collection Time: 06/01/15  1:45 PM  Result Value Ref Range   Squamous Epithelial / LPF 6-30 (A) NONE SEEN   WBC, UA 0-5 0 - 5 WBC/hpf   RBC / HPF NONE SEEN 0 - 5 RBC/hpf   Bacteria, UA FEW (A) NONE  SEEN   Urine-Other MUCOUS PRESENT   CBC     Status: Abnormal   Collection Time: 06/01/15  1:51 PM  Result Value Ref Range   WBC 8.3 4.0 - 10.5 K/uL   RBC 3.69 (L) 3.87 - 5.11 MIL/uL   Hemoglobin 11.3 (L) 12.0 - 15.0 g/dL   HCT 32.0 (L) 36.0 - 46.0 %   MCV 86.7 78.0 - 100.0 fL   MCH 30.6 26.0 - 34.0 pg   MCHC 35.3 30.0 - 36.0 g/dL   RDW 13.3 11.5 - 15.5 %   Platelets 292 150 - 400 K/uL    Assessment and Plan  25 y.o. G1P0 at [redacted]w[redacted]d IUP  Allergies  Plan: Discharge home RX Zyrtec Order outpatient anatomy ultrasound Keep scheduled OB appointment at [facility], CNM 06/01/2015 2:41 PM

## 2015-06-01 NOTE — Discharge Instructions (Signed)
Safe Medications in Pregnancy   Acne: Benzoyl Peroxide Salicylic Acid  Backache/Headache: Tylenol: 2 regular strength every 4 hours OR              2 Extra strength every 6 hours  Colds/Coughs/Allergies: Benadryl (alcohol free) 25 mg every 6 hours as needed Breath right strips Claritin Cepacol throat lozenges Chloraseptic throat spray Cold-Eeze- up to three times per day Cough drops, alcohol free Flonase (by prescription only) Guaifenesin Mucinex Robitussin DM (plain only, alcohol free) Saline nasal spray/drops Sudafed (pseudoephedrine) & Actifed ** use only after [redacted] weeks gestation and if you do not have high blood pressure Tylenol Vicks Vaporub Zinc lozenges Zyrtec   Constipation: Colace Ducolax suppositories Fleet enema Glycerin suppositories Metamucil Milk of magnesia Miralax Senokot Smooth move tea  Diarrhea: Kaopectate Imodium A-D  *NO pepto Bismol  Hemorrhoids: Anusol Anusol HC Preparation H Tucks  Indigestion: Tums Maalox Mylanta Zantac  Pepcid  Insomnia: Benadryl (alcohol free) 25mg  every 6 hours as needed Tylenol PM Unisom, no Gelcaps  Leg Cramps: Tums MagGel  Nausea/Vomiting:  Bonine Dramamine Emetrol Ginger extract Sea bands Meclizine  Nausea medication to take during pregnancy:  Unisom (doxylamine succinate 25 mg tablets) Take one tablet daily at bedtime. If symptoms are not adequately controlled, the dose can be increased to a maximum recommended dose of two tablets daily (1/2 tablet in the morning, 1/2 tablet mid-afternoon and one at bedtime). Vitamin B6 100mg  tablets. Take one tablet twice a day (up to 200 mg per day).  Skin Rashes: Aveeno products Benadryl cream or 25mg  every 6 hours as needed Calamine Lotion 1% cortisone cream  Yeast infection: Gyne-lotrimin 7 Monistat 7   **If taking multiple medications, please check labels to avoid duplicating the same active ingredients **take medication as directed on  the label ** Do not exceed 4000 mg of tylenol in 24 hours **Do not take medications that contain aspirin or ibuprofen    Allergies An allergy is when your body reacts to a substance in a way that is not normal. An allergic reaction can happen after you:  Eat something.  Breathe in something.  Touch something. WHAT KINDS OF ALLERGIES ARE THERE? You can be allergic to:  Things that are only around during certain seasons, like molds and pollens.  Foods.  Drugs.  Insects.  Animal dander. WHAT ARE SYMPTOMS OF ALLERGIES?  Puffiness (swelling). This may happen on the lips, face, tongue, mouth, or throat.  Sneezing.  Coughing.  Breathing loudly (wheezing).  Stuffy nose.  Tingling in the mouth.  A rash.  Itching.  Itchy, red, puffy areas of skin (hives).  Watery eyes.  Throwing up (vomiting).  Watery poop (diarrhea).  Dizziness.  Feeling faint or fainting.  Trouble breathing or swallowing.  A tight feeling in the chest.  A fast heartbeat. HOW ARE ALLERGIES DIAGNOSED? Allergies can be diagnosed with:  A medical and family history.  Skin tests.  Blood tests.  A food diary. A food diary is a record of all the foods, drinks, and symptoms you have each day.  The results of an elimination diet. This diet involves making sure not to eat certain foods and then seeing what happens when you start eating them again. HOW ARE ALLERGIES TREATED? There is no cure for allergies, but allergic reactions can be treated with medicine. Severe reactions usually need to be treated at a hospital.  HOW CAN REACTIONS BE PREVENTED? The best way to prevent an allergic reaction is to avoid the thing you are  allergic to. Allergy shots and medicines can also help prevent reactions in some cases.   This information is not intended to replace advice given to you by your health care provider. Make sure you discuss any questions you have with your health care provider.   Document  Released: 05/29/2012 Document Revised: 02/22/2014 Document Reviewed: 11/13/2013 Elsevier Interactive Patient Education [employer].

## 2015-06-05 ENCOUNTER — Ambulatory Visit (HOSPITAL_COMMUNITY)
Admission: RE | Admit: 2015-06-05 | Discharge: 2015-06-05 | Disposition: A | Discharge status: Home / Self Care | Payer: Medicaid Other | Source: Ambulatory Visit | Attending: Family | Admitting: Family

## 2015-06-05 ENCOUNTER — Encounter (HOSPITAL_COMMUNITY): Payer: Self-pay

## 2015-06-05 ENCOUNTER — Other Ambulatory Visit (HOSPITAL_COMMUNITY): Payer: Self-pay | Admitting: Family

## 2015-06-05 DIAGNOSIS — O0932 Supervision of pregnancy with insufficient antenatal care, second trimester: Secondary | ICD-10-CM | POA: Insufficient documentation

## 2015-06-05 DIAGNOSIS — Z3482 Encounter for supervision of other normal pregnancy, second trimester: Secondary | ICD-10-CM

## 2015-06-05 DIAGNOSIS — Z3490 Encounter for supervision of normal pregnancy, unspecified, unspecified trimester: Secondary | ICD-10-CM | POA: Insufficient documentation

## 2015-06-05 DIAGNOSIS — Z36 Encounter for antenatal screening of mother: Secondary | ICD-10-CM | POA: Insufficient documentation

## 2015-06-05 DIAGNOSIS — Z3A21 21 weeks gestation of pregnancy: Secondary | ICD-10-CM | POA: Insufficient documentation

## 2015-06-05 DIAGNOSIS — Z1389 Encounter for screening for other disorder: Secondary | ICD-10-CM

## 2015-06-05 DIAGNOSIS — Z3689 Encounter for other specified antenatal screening: Secondary | ICD-10-CM

## 2015-06-19 ENCOUNTER — Encounter: Payer: Self-pay | Admitting: Family

## 2015-06-19 ENCOUNTER — Encounter: Payer: Medicaid Other | Admitting: Family

## 2015-06-20 ENCOUNTER — Encounter: Payer: Self-pay | Admitting: Family Medicine

## 2015-06-20 ENCOUNTER — Other Ambulatory Visit (HOSPITAL_COMMUNITY)
Admission: RE | Admit: 2015-06-20 | Discharge: 2015-06-20 | Disposition: A | Discharge status: Home / Self Care | Payer: Medicaid Other | Source: Ambulatory Visit | Attending: Family Medicine | Admitting: Family Medicine

## 2015-06-20 ENCOUNTER — Ambulatory Visit (INDEPENDENT_AMBULATORY_CARE_PROVIDER_SITE_OTHER): Payer: Medicaid Other | Admitting: Family Medicine

## 2015-06-20 VITALS — BP 116/82 | HR 104 | Temp 97.8°F | Wt 136.1 lb

## 2015-06-20 DIAGNOSIS — Z3482 Encounter for supervision of other normal pregnancy, second trimester: Secondary | ICD-10-CM

## 2015-06-20 DIAGNOSIS — Z113 Encounter for screening for infections with a predominantly sexual mode of transmission: Secondary | ICD-10-CM | POA: Insufficient documentation

## 2015-06-20 DIAGNOSIS — Z01419 Encounter for gynecological examination (general) (routine) without abnormal findings: Secondary | ICD-10-CM | POA: Insufficient documentation

## 2015-06-20 LAB — POCT URINALYSIS DIP (DEVICE)
BILIRUBIN URINE: NEGATIVE
GLUCOSE, UA: NEGATIVE mg/dL
Hgb urine dipstick: NEGATIVE
KETONES UR: NEGATIVE mg/dL
NITRITE: NEGATIVE
PROTEIN: NEGATIVE mg/dL
Specific Gravity, Urine: 1.015 (ref 1.005–1.030)
Urobilinogen, UA: 1 mg/dL (ref 0.0–1.0)
pH: 6.5 (ref 5.0–8.0)

## 2015-06-20 NOTE — Progress Notes (Signed)
Initial prenatal info packet given Breastfeeding tip of the week reviewed Initial prenatal labs today Flu declined

## 2015-06-20 NOTE — Patient Instructions (Signed)
Levonorgestrel intrauterine device (IUD) What is this medicine? LEVONORGESTREL IUD (LEE voe nor jes trel) is a contraceptive (birth control) device. The device is placed inside the uterus by a healthcare professional. It is used to prevent pregnancy and can also be used to treat heavy bleeding that occurs during your period. Depending on the device, it can be used for 3 to 5 years. This medicine may be used for other purposes; ask your health care provider or pharmacist if you have questions. What should I tell my health care provider before I take this medicine? They need to know if you have any of these conditions: -abnormal Pap smear -cancer of the breast, uterus, or cervix -diabetes -endometritis -genital or pelvic infection now or in the past -have more than one sexual partner or your partner has more than one partner -heart disease -history of an ectopic or tubal pregnancy -immune system problems -IUD in place -liver disease or tumor -problems with blood clots or take blood-thinners -use intravenous drugs -uterus of unusual shape -vaginal bleeding that has not been explained -an unusual or allergic reaction to levonorgestrel, other hormones, silicone, or polyethylene, medicines, foods, dyes, or preservatives -pregnant or trying to get pregnant -breast-feeding How should I use this medicine? This device is placed inside the uterus by a health care professional. Talk to your pediatrician regarding the use of this medicine in children. Special care may be needed. Overdosage: If you think you have taken too much of this medicine contact a poison control center or emergency room at once. NOTE: This medicine is only for you. Do not share this medicine with others. What if I miss a dose? This does not apply. What may interact with this medicine? Do not take this medicine with any of the following medications: -amprenavir -bosentan -fosamprenavir This medicine may also interact with  the following medications: -aprepitant -barbiturate medicines for inducing sleep or treating seizures -bexarotene -griseofulvin -medicines to treat seizures like carbamazepine, ethotoin, felbamate, oxcarbazepine, phenytoin, topiramate -modafinil -pioglitazone -rifabutin -rifampin -rifapentine -some medicines to treat HIV infection like atazanavir, indinavir, lopinavir, nelfinavir, tipranavir, ritonavir -St. John's wort -warfarin This list may not describe all possible interactions. Give your health care provider a list of all the medicines, herbs, non-prescription drugs, or dietary supplements you use. Also tell them if you smoke, drink alcohol, or use illegal drugs. Some items may interact with your medicine. What should I watch for while using this medicine? Visit your doctor or health care professional for regular check ups. See your doctor if you or your partner has sexual contact with others, becomes HIV positive, or gets a sexual transmitted disease. This product does not protect you against HIV infection (AIDS) or other sexually transmitted diseases. You can check the placement of the IUD yourself by reaching up to the top of your vagina with clean fingers to feel the threads. Do not pull on the threads. It is a good habit to check placement after each menstrual period. Call your doctor right away if you feel more of the IUD than just the threads or if you cannot feel the threads at all. The IUD may come out by itself. You may become pregnant if the device comes out. If you notice that the IUD has come out use a backup birth control method like condoms and call your health care provider. Using tampons will not change the position of the IUD and are okay to use during your period. What side effects may I notice from receiving this medicine?   the device comes out. If you notice that the IUD has come out use a backup birth control method like condoms and call your health care provider.  Using tampons will not change the position of the IUD and are okay to use during your period.  What side effects may I notice from receiving this medicine?  Side effects that you should report to your doctor or health care professional as soon as possible:  -allergic reactions like skin rash, itching or  hives, swelling of the face, lips, or tongue  -fever, flu-like symptoms  -genital sores  -high blood pressure  -no menstrual period for 6 weeks during use  -pain, swelling, warmth in the leg  -pelvic pain or tenderness  -severe or sudden headache  -signs of pregnancy  -stomach cramping  -sudden shortness of breath  -trouble with balance, talking, or walking  -unusual vaginal bleeding, discharge  -yellowing of the eyes or skin  Side effects that usually do not require medical attention (report to your doctor or health care professional if they continue or are bothersome):  -acne  -breast pain  -change in sex drive or performance  -changes in weight  -cramping, dizziness, or faintness while the device is being inserted  -headache  -irregular menstrual bleeding within first 3 to 6 months of use  -nausea  This list may not describe all possible side effects. Call your doctor for medical advice about side effects. You may report side effects to FDA at 1-800-FDA-1088.  Where should I keep my medicine?  This does not apply.  NOTE: This sheet is a summary. It may not cover all possible information. If you have questions about this medicine, talk to your doctor, pharmacist, or health care provider.     © 2016, Elsevier/Gold Standard. (2011-03-04 13:54:04)  Etonogestrel implant  What is this medicine?  ETONOGESTREL (et oh noe JES trel) is a contraceptive (birth control) device. It is used to prevent pregnancy. It can be used for up to 3 years.  This medicine may be used for other purposes; ask your health care provider or pharmacist if you have questions.  What should I tell my health care provider before I take this medicine?  They need to know if you have any of these conditions:  -abnormal vaginal bleeding  -blood vessel disease or blood clots  -cancer of the breast, cervix, or liver  -depression  -diabetes  -gallbladder disease  -headaches  -heart disease or recent heart attack  -high blood pressure  -high  cholesterol  -kidney disease  -liver disease  -renal disease  -seizures  -tobacco smoker  -an unusual or allergic reaction to etonogestrel, other hormones, anesthetics or antiseptics, medicines, foods, dyes, or preservatives  -pregnant or trying to get pregnant  -breast-feeding  How should I use this medicine?  This device is inserted just under the skin on the inner side of your upper arm by a health care professional.  Talk to your pediatrician regarding the use of this medicine in children. Special care may be needed.  Overdosage: If you think you have taken too much of this medicine contact a poison control center or emergency room at once.  NOTE: This medicine is only for you. Do not share this medicine with others.  What if I miss a dose?  This does not apply.  What may interact with this medicine?  Do not take this medicine with any of the following medications:  -amprenavir  -bosentan  -fosamprenavir  This medicine may also   interact with the following medications:  -barbiturate medicines for inducing sleep or treating seizures  -certain medicines for fungal infections like ketoconazole and itraconazole  -griseofulvin  -medicines to treat seizures like carbamazepine, felbamate, oxcarbazepine, phenytoin, topiramate  -modafinil  -phenylbutazone  -rifampin  -some medicines to treat HIV infection like atazanavir, indinavir, lopinavir, nelfinavir, tipranavir, ritonavir  -St. John's wort  This list may not describe all possible interactions. Give your health care provider a list of all the medicines, herbs, non-prescription drugs, or dietary supplements you use. Also tell them if you smoke, drink alcohol, or use illegal drugs. Some items may interact with your medicine.  What should I watch for while using this medicine?  This product does not protect you against HIV infection (AIDS) or other sexually transmitted diseases.  You should be able to feel the implant by pressing your fingertips over the skin where it  was inserted. Contact your doctor if you cannot feel the implant, and use a non-hormonal birth control method (such as condoms) until your doctor confirms that the implant is in place. If you feel that the implant may have broken or become bent while in your arm, contact your healthcare provider.  What side effects may I notice from receiving this medicine?  Side effects that you should report to your doctor or health care professional as soon as possible:  -allergic reactions like skin rash, itching or hives, swelling of the face, lips, or tongue  -breast lumps  -changes in emotions or moods  -depressed mood  -heavy or prolonged menstrual bleeding  -pain, irritation, swelling, or bruising at the insertion site  -scar at site of insertion  -signs of infection at the insertion site such as fever, and skin redness, pain or discharge  -signs of pregnancy  -signs and symptoms of a blood clot such as breathing problems; changes in vision; chest pain; severe, sudden headache; pain, swelling, warmth in the leg; trouble speaking; sudden numbness or weakness of the face, arm or leg  -signs and symptoms of liver injury like dark yellow or brown urine; general ill feeling or flu-like symptoms; light-colored stools; loss of appetite; nausea; right upper belly pain; unusually weak or tired; yellowing of the eyes or skin  -unusual vaginal bleeding, discharge  -signs and symptoms of a stroke like changes in vision; confusion; trouble speaking or understanding; severe headaches; sudden numbness or weakness of the face, arm or leg; trouble walking; dizziness; loss of balance or coordination  Side effects that usually do not require medical attention (Report these to your doctor or health care professional if they continue or are bothersome.):  -acne  -back pain  -breast pain  -changes in weight  -dizziness  -general ill feeling or flu-like symptoms  -headache  -irregular menstrual bleeding  -nausea  -sore throat  -vaginal irritation  or inflammation  This list may not describe all possible side effects. Call your doctor for medical advice about side effects. You may report side effects to FDA at 1-800-FDA-1088.  Where should I keep my medicine?  This drug is given in a hospital or clinic and will not be stored at home.  NOTE: This sheet is a summary. It may not cover all possible information. If you have questions about this medicine, talk to your doctor, pharmacist, or health care provider.     © 2016, Elsevier/Gold Standard. (2013-11-16 14:07:06)

## 2015-06-20 NOTE — Progress Notes (Signed)
Subjective:  Melinda Burch is a 25 y.o. G2P0010 at [redacted]w[redacted]d being seen today for ongoing prenatal care.  She is currently monitored for the following issues for this low-risk pregnancy and has ECZEMA and Supervision of normal pregnancy, antepartum on her problem list.  Patient reports no complaints.  Contractions: Not present. Vag. Bleeding: None.  Movement: Present. Denies leaking of fluid.   The following portions of the patient's history were reviewed and updated as appropriate: allergies, current medications, past family history, past medical history, past social history, past surgical history and problem list. Problem list updated.  Objective:   Filed Vitals:   06/20/15 0830  BP: 116/82  Pulse: 104  Temp: 97.8 F (36.6 C)  Weight: 136 lb 1.6 oz (61.735 kg)    Fetal Status: Fetal Heart Rate (bpm): 150   Movement: Present     General:  Alert, oriented and cooperative. Patient is in no acute distress.  Skin: Skin is warm and dry. No rash noted.   Cardiovascular: Normal heart rate noted  Respiratory: Normal respiratory effort, no problems with respiration noted  Abdomen: Soft, gravid, appropriate for gestational age. Pain/Pressure: Present     Pelvic: Vag. Bleeding: None Vag D/C Character: White   Cervical exam performed        Extremities: Normal range of motion.  Edema: None  Mental Status: Normal mood and affect. Normal behavior. Normal judgment and thought content.   Urinalysis:      Assessment and Plan:  Pregnancy: G2P0010 at [redacted]w[redacted]d  1. Supervision of normal pregnancy, antepartum, second trimester As part of her first visit with the clinic, we discussed how the team of doctors and midwives work during the labor and delivery process. We discussed signs of preterm labor and when to call the clinic/visit the MAU.   Ms. Deleon had no acute concerns, though did mention occasional GERD symptoms -- we counseled her to initially try TUMS for symptomatic relief but to alert [place] if the  pain is no longer controlled so we can prescribe a medicine.   First pap smear performed.   - Prenatal Profile - Hemoglobinopathy evaluation - GC/Chlamydia probe amp ([place])not at [facility] - Culture, OB Urine - Prescript Monitor Profile(19) - Cytology - PAP  Preterm labor symptoms and general obstetric precautions including but not limited to vaginal bleeding, contractions, leaking of fluid and fetal movement were reviewed in detail with the patient. Please refer to After Visit Summary for other counseling recommendations.   Plan to follow-up in 4 weeks for 28wk visit.   Glendale Chard, Med Student  I have seen and examined this patient in conjunction with the Medical Student.  I took the history and preformed the physical.  I have edited the above note as necessary.  Truett Mainland, DO 06/20/2015 10:44 AM

## 2015-06-21 LAB — CULTURE, OB URINE: Colony Count: 50000

## 2015-06-22 LAB — PRESCRIPTION MONITORING PROFILE (19 PANEL)
Amphetamine/Meth: NEGATIVE ng/mL
BUPRENORPHINE, URINE: NEGATIVE ng/mL
Barbiturate Screen, Urine: NEGATIVE ng/mL
Benzodiazepine Screen, Urine: NEGATIVE ng/mL
CANNABINOID SCRN UR: NEGATIVE ng/mL
CARISOPRODOL, URINE: NEGATIVE ng/mL
Cocaine Metabolites: NEGATIVE ng/mL
Creatinine, Urine: 112.73 mg/dL (ref >20.0)
ECSTASY: NEGATIVE ng/mL
FENTANYL URINE: NEGATIVE ng/mL
MEPERIDINE UR: NEGATIVE ng/mL
METHADONE SCREEN, URINE: NEGATIVE ng/mL
METHAQUALONE SCREEN (URINE): NEGATIVE ng/mL
NITRITES URINE, INITIAL: NEGATIVE ug/mL
OXYCODONE SCRN UR: NEGATIVE ng/mL
Opiate Screen, Urine: NEGATIVE ng/mL
PROPOXYPHENE: NEGATIVE ng/mL
Phencyclidine, Ur: NEGATIVE ng/mL
TAPENTADOLUR: NEGATIVE ng/mL
TRAMADOL UR: NEGATIVE ng/mL
Zolpidem, Urine: NEGATIVE ng/mL
pH, Initial: 7 pH (ref 4.5–8.9)

## 2015-06-23 ENCOUNTER — Encounter: Payer: Self-pay | Admitting: Family Medicine

## 2015-06-23 ENCOUNTER — Other Ambulatory Visit: Payer: Self-pay | Admitting: Family Medicine

## 2015-06-23 DIAGNOSIS — O98812 Other maternal infectious and parasitic diseases complicating pregnancy, second trimester: Secondary | ICD-10-CM

## 2015-06-23 DIAGNOSIS — A749 Chlamydial infection, unspecified: Secondary | ICD-10-CM | POA: Insufficient documentation

## 2015-06-23 LAB — PRENATAL PROFILE (SOLSTAS)
Antibody Screen: NEGATIVE
Basophils Absolute: 0 cells/uL (ref 0–200)
Basophils Relative: 0 %
EOS PCT: 2 %
Eosinophils Absolute: 140 cells/uL (ref 15–500)
HEMATOCRIT: 32.2 % — AB (ref 35.0–45.0)
HEMOGLOBIN: 10.8 g/dL — AB (ref 11.7–15.5)
HEP B S AG: NEGATIVE
HIV 1&2 Ab, 4th Generation: NONREACTIVE
LYMPHS ABS: 1260 {cells}/uL (ref 850–3900)
Lymphocytes Relative: 18 %
MCH: 30.1 pg (ref 27.0–33.0)
MCHC: 33.5 g/dL (ref 32.0–36.0)
MCV: 89.7 fL (ref 80.0–100.0)
MPV: 10.5 fL (ref 7.5–12.5)
Monocytes Absolute: 490 cells/uL (ref 200–950)
Monocytes Relative: 7 %
NEUTROS ABS: 5110 {cells}/uL (ref 1500–7800)
Neutrophils Relative %: 73 %
Platelets: 263 10*3/uL (ref 140–400)
RBC: 3.59 MIL/uL — AB (ref 3.80–5.10)
RDW: 13.3 % (ref 11.0–15.0)
Rh Type: POSITIVE
Rubella: 1.13 Index — ABNORMAL HIGH (ref <0.90)
WBC: 7 10*3/uL (ref 3.8–10.8)

## 2015-06-23 LAB — GC/CHLAMYDIA PROBE AMP (~~LOC~~) NOT AT ARMC
Chlamydia: POSITIVE — AB
NEISSERIA GONORRHEA: NEGATIVE

## 2015-06-23 LAB — CYTOLOGY - PAP

## 2015-06-24 ENCOUNTER — Telehealth: Payer: Self-pay | Admitting: *Deleted

## 2015-06-24 NOTE — Telephone Encounter (Signed)
Per Dr. Nehemiah Settle chlamydia positive , needs treatment with rocephin and azithromycin.
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
## 2015-06-25 LAB — HEMOGLOBINOPATHY EVALUATION
HEMOGLOBIN OTHER: 0 %
HGB A2 QUANT: 3.2 % (ref 2.2–3.2)
HGB S QUANTITAION: 42.3 % — AB
Hgb A: 54.2 % — ABNORMAL LOW (ref 96.8–97.8)
Hgb F Quant: 0.3 % (ref 0.0–2.0)

## 2015-06-26 ENCOUNTER — Telehealth: Payer: Self-pay | Admitting: *Deleted

## 2015-06-26 NOTE — Telephone Encounter (Signed)
Called pt and was unable to speak with her or leave a message. The call was dropped after multiple rings on 2 separate calls. Pt needs to be informed of having + sickle cell trait and advised to have FOB tested for sickle cell. He may be tested by his PCP or may schedule appt w/ [facility]  [address]  [phone]

## 2015-07-04 NOTE — Telephone Encounter (Signed)
Attempted to call pt and was unable to LM due to having no VM box.  Letter sent.

## 2015-07-14 ENCOUNTER — Encounter (HOSPITAL_COMMUNITY): Payer: Self-pay | Admitting: Emergency Medicine

## 2015-07-14 ENCOUNTER — Emergency Department (HOSPITAL_COMMUNITY)
Admission: EM | Admit: 2015-07-14 | Discharge: 2015-07-14 | Disposition: A | Discharge status: Home / Self Care | Payer: Medicaid Other | Attending: Emergency Medicine | Admitting: Emergency Medicine

## 2015-07-14 DIAGNOSIS — R000 Tachycardia, unspecified: Secondary | ICD-10-CM | POA: Diagnosis not present

## 2015-07-14 DIAGNOSIS — O9A213 Injury, poisoning and certain other consequences of external causes complicating pregnancy, third trimester: Secondary | ICD-10-CM

## 2015-07-14 DIAGNOSIS — S0083XA Contusion of other part of head, initial encounter: Secondary | ICD-10-CM | POA: Insufficient documentation

## 2015-07-14 DIAGNOSIS — Y998 Other external cause status: Secondary | ICD-10-CM | POA: Diagnosis not present

## 2015-07-14 DIAGNOSIS — Z79899 Other long term (current) drug therapy: Secondary | ICD-10-CM | POA: Diagnosis not present

## 2015-07-14 DIAGNOSIS — Y9289 Other specified places as the place of occurrence of the external cause: Secondary | ICD-10-CM | POA: Insufficient documentation

## 2015-07-14 DIAGNOSIS — R140 Abdominal distension (gaseous): Secondary | ICD-10-CM | POA: Insufficient documentation

## 2015-07-14 DIAGNOSIS — T148XXA Other injury of unspecified body region, initial encounter: Secondary | ICD-10-CM

## 2015-07-14 DIAGNOSIS — Y9389 Activity, other specified: Secondary | ICD-10-CM | POA: Diagnosis not present

## 2015-07-14 DIAGNOSIS — Z3A28 28 weeks gestation of pregnancy: Secondary | ICD-10-CM | POA: Diagnosis not present

## 2015-07-14 DIAGNOSIS — O9989 Other specified diseases and conditions complicating pregnancy, childbirth and the puerperium: Secondary | ICD-10-CM | POA: Insufficient documentation

## 2015-07-14 DIAGNOSIS — Z3490 Encounter for supervision of normal pregnancy, unspecified, unspecified trimester: Secondary | ICD-10-CM

## 2015-07-14 MED ORDER — ACETAMINOPHEN 325 MG PO TABS
650.0000 mg | ORAL_TABLET | Freq: Once | ORAL | Status: AC
  Administered 2015-07-14: 650 mg via ORAL
  Filled 2015-07-14: qty 2

## 2015-07-14 NOTE — ED Notes (Signed)
Notified OB Rapid Response of patient's situation - Juliann Pulse, RN states she will come see her.

## 2015-07-14 NOTE — ED Notes (Signed)
OB Rapid Response RN at bedside. 
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
## 2015-07-14 NOTE — ED Notes (Signed)
Patient verbalized understanding of discharge instructions and denies any further needs or questions at this time. VS stable. Patient ambulatory with steady gait.  

## 2015-07-14 NOTE — Progress Notes (Signed)
NST reviewed.  Not reactive, but appropriate and reassuring for gestational age.
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
## 2015-07-14 NOTE — Discharge Instructions (Signed)
You have a small bruise/ hematoma on your forehead You and your fetus  have been monitorer:  all appears normal if you develop abdominal pain, leaking of fluid or blood from your vagina please go immediately to [facility] for further evaluation  You can safely take Tylenol for your headache

## 2015-07-16 ENCOUNTER — Ambulatory Visit (INDEPENDENT_AMBULATORY_CARE_PROVIDER_SITE_OTHER): Payer: Medicaid Other | Admitting: Family

## 2015-07-16 VITALS — BP 111/75 | HR 112 | Wt 134.6 lb

## 2015-07-16 DIAGNOSIS — Z3482 Encounter for supervision of other normal pregnancy, second trimester: Secondary | ICD-10-CM | POA: Diagnosis not present

## 2015-07-16 DIAGNOSIS — Z23 Encounter for immunization: Secondary | ICD-10-CM

## 2015-07-16 DIAGNOSIS — O98812 Other maternal infectious and parasitic diseases complicating pregnancy, second trimester: Secondary | ICD-10-CM

## 2015-07-16 DIAGNOSIS — A749 Chlamydial infection, unspecified: Secondary | ICD-10-CM

## 2015-07-16 LAB — POCT URINALYSIS DIP (DEVICE)
Bilirubin Urine: NEGATIVE
Glucose, UA: NEGATIVE mg/dL
HGB URINE DIPSTICK: NEGATIVE
Ketones, ur: NEGATIVE mg/dL
LEUKOCYTES UA: NEGATIVE
NITRITE: NEGATIVE
PH: 7 (ref 5.0–8.0)
PROTEIN: NEGATIVE mg/dL
Specific Gravity, Urine: 1.02 (ref 1.005–1.030)
UROBILINOGEN UA: 0.2 mg/dL (ref 0.0–1.0)

## 2015-07-16 MED ORDER — TETANUS-DIPHTH-ACELL PERTUSSIS 5-2.5-18.5 LF-MCG/0.5 IM SUSP
0.5000 mL | Freq: Once | INTRAMUSCULAR | Status: AC
  Administered 2015-07-16: 0.5 mL via INTRAMUSCULAR

## 2015-07-16 NOTE — Patient Instructions (Addendum)
[facility] [place],  [address] Testing on Thursdays, Call to make an appointment [phone] Toll Free: [phone]    Fetal Movement Counts Patient Name: __________________________________________________ Patient Due Date: ____________________ Performing a fetal movement count is highly recommended in high-risk pregnancies, but it is good for every pregnant woman to do. Your health care provider may ask you to start counting fetal movements at 28 weeks of the pregnancy. Fetal movements often increase:  After eating a full meal.  After physical activity.  After eating or drinking something sweet or cold.  At rest. Pay attention to when you feel the baby is most active. This will help you notice a pattern of your baby's sleep and wake cycles and what factors contribute to an increase in fetal movement. It is important to perform a fetal movement count at the same time each day when your baby is normally most active.  HOW TO COUNT FETAL MOVEMENTS 1. Find a quiet and comfortable area to sit or lie down on your left side. Lying on your left side provides the best blood and oxygen circulation to your baby. 2. Write down the day and time on a sheet of paper or in a journal. 3. Start counting kicks, flutters, swishes, rolls, or jabs in a 2-hour period. You should feel at least 10 movements within 2 hours. 4. If you do not feel 10 movements in 2 hours, wait 2-3 hours and count again. Look for a change in the pattern or not enough counts in 2 hours. SEEK MEDICAL CARE IF:  You feel less than 10 counts in 2 hours, tried twice.  There is no movement in over an hour.  The pattern is changing or taking longer each day to reach 10 counts in 2 hours.  You feel the baby is not moving as he or she usually does. Date: ____________ Movements: ____________ Start time: ____________ Elizebeth Koller time: ____________  Date: ____________ Movements:  ____________ Start time: ____________ Elizebeth Koller time: ____________ Date: ____________ Movements: ____________ Start time: ____________ Elizebeth Koller time: ____________ Date: ____________ Movements: ____________ Start time: ____________ Elizebeth Koller time: ____________ Date: ____________ Movements: ____________ Start time: ____________ Elizebeth Koller time: ____________ Date: ____________ Movements: ____________ Start time: ____________ Elizebeth Koller time: ____________ Date: ____________ Movements: ____________ Start time: ____________ Elizebeth Koller time: ____________ Date: ____________ Movements: ____________ Start time: ____________ Elizebeth Koller time: ____________  Date: ____________ Movements: ____________ Start time: ____________ Elizebeth Koller time: ____________ Date: ____________ Movements: ____________ Start time: ____________ Elizebeth Koller time: ____________ Date: ____________ Movements: ____________ Start time: ____________ Elizebeth Koller time: ____________ Date: ____________ Movements: ____________ Start time: ____________ Elizebeth Koller time: ____________ Date: ____________ Movements: ____________ Start time: ____________ Elizebeth Koller time: ____________ Date: ____________ Movements: ____________ Start time: ____________ Elizebeth Koller time: ____________ Date: ____________ Movements: ____________ Start time: ____________ Elizebeth Koller time: ____________  Date: ____________ Movements: ____________ Start time: ____________ Elizebeth Koller time: ____________ Date: ____________ Movements: ____________ Start time: ____________ Elizebeth Koller time: ____________ Date: ____________ Movements: ____________ Start time: ____________ Elizebeth Koller time: ____________ Date: ____________ Movements: ____________ Start time: ____________ Elizebeth Koller time: ____________ Date: ____________ Movements: ____________ Start time: ____________ Elizebeth Koller time: ____________ Date: ____________ Movements: ____________ Start time: ____________ Elizebeth Koller time: ____________ Date: ____________ Movements: ____________ Start time: ____________ Elizebeth Koller  time: ____________  Date: ____________ Movements: ____________ Start time: ____________ Elizebeth Koller time: ____________ Date: ____________ Movements: ____________ Start time: ____________ Elizebeth Koller time: ____________ Date: ____________ Movements: ____________ Start time: ____________ Elizebeth Koller time: ____________ Date: ____________ Movements: ____________ Start time: ____________ Elizebeth Koller time: ____________ Date: ____________ Movements: ____________ Start time: ____________ Elizebeth Koller  time: ____________ Date: ____________ Movements: ____________ Start time: ____________ Elizebeth Koller time: ____________ Date: ____________ Movements: ____________ Start time: ____________ Elizebeth Koller time: ____________  Date: ____________ Movements: ____________ Start time: ____________ Elizebeth Koller time: ____________ Date: ____________ Movements: ____________ Start time: ____________ Elizebeth Koller time: ____________ Date: ____________ Movements: ____________ Start time: ____________ Elizebeth Koller time: ____________ Date: ____________ Movements: ____________ Start time: ____________ Elizebeth Koller time: ____________ Date: ____________ Movements: ____________ Start time: ____________ Elizebeth Koller time: ____________ Date: ____________ Movements: ____________ Start time: ____________ Elizebeth Koller time: ____________ Date: ____________ Movements: ____________ Start time: ____________ Elizebeth Koller time: ____________  Date: ____________ Movements: ____________ Start time: ____________ Elizebeth Koller time: ____________ Date: ____________ Movements: ____________ Start time: ____________ Elizebeth Koller time: ____________ Date: ____________ Movements: ____________ Start time: ____________ Elizebeth Koller time: ____________ Date: ____________ Movements: ____________ Start time: ____________ Elizebeth Koller time: ____________ Date: ____________ Movements: ____________ Start time: ____________ Elizebeth Koller time: ____________ Date: ____________ Movements: ____________ Start time: ____________ Elizebeth Koller time: ____________ Date: ____________  Movements: ____________ Start time: ____________ Elizebeth Koller time: ____________  Date: ____________ Movements: ____________ Start time: ____________ Elizebeth Koller time: ____________ Date: ____________ Movements: ____________ Start time: ____________ Elizebeth Koller time: ____________ Date: ____________ Movements: ____________ Start time: ____________ Elizebeth Koller time: ____________ Date: ____________ Movements: ____________ Start time: ____________ Elizebeth Koller time: ____________ Date: ____________ Movements: ____________ Start time: ____________ Elizebeth Koller time: ____________ Date: ____________ Movements: ____________ Start time: ____________ Elizebeth Koller time: ____________ Date: ____________ Movements: ____________ Start time: ____________ Elizebeth Koller time: ____________  Date: ____________ Movements: ____________ Start time: ____________ Elizebeth Koller time: ____________ Date: ____________ Movements: ____________ Start time: ____________ Elizebeth Koller time: ____________ Date: ____________ Movements: ____________ Start time: ____________ Elizebeth Koller time: ____________ Date: ____________ Movements: ____________ Start time: ____________ Elizebeth Koller time: ____________ Date: ____________ Movements: ____________ Start time: ____________ Elizebeth Koller time: ____________ Date: ____________ Movements: ____________ Start time: ____________ Elizebeth Koller time: ____________   This information is not intended to replace advice given to you by your health care provider. Make sure you discuss any questions you have with your health care provider.   Document Released: 03/03/2006 Document Revised: 02/22/2014 Document Reviewed: 11/29/2011 Elsevier Interactive Patient Education 2016 Elsevier Inc. [employer] of the uterus can occur throughout pregnancy. Contractions are not always a sign that you are in labor.  WHAT ARE BRAXTON HICKS CONTRACTIONS?  Contractions that occur before labor are called Braxton Hicks contractions, or false labor. Toward the end of pregnancy  (32-34 weeks), these contractions can develop more often and may become more forceful. This is not true labor because these contractions do not result in opening (dilatation) and thinning of the cervix. They are sometimes difficult to tell apart from true labor because these contractions can be forceful and people have different pain tolerances. You should not feel embarrassed if you go to the hospital with false labor. Sometimes, the only way to tell if you are in true labor is for your health care provider to look for changes in the cervix. If there are no prenatal problems or other health problems associated with the pregnancy, it is completely safe to be sent home with false labor and await the onset of true labor. HOW CAN YOU TELL THE DIFFERENCE BETWEEN TRUE AND FALSE LABOR? False Labor  The contractions of false labor are usually shorter and not as hard as those of true labor.   The contractions are usually irregular.   The contractions are often felt in the front of the lower abdomen and in the groin.   The contractions may go away when you walk around or change positions while lying down.   The contractions get weaker and are  shorter lasting as time goes on.   The contractions do not usually become progressively stronger, regular, and closer together as with true labor.  True Labor 5. Contractions in true labor last 30-70 seconds, become very regular, usually become more intense, and increase in frequency.  6. The contractions do not go away with walking.  7. The discomfort is usually felt in the top of the uterus and spreads to the lower abdomen and low back.  8. True labor can be determined by your health care provider with an exam. This will show that the cervix is dilating and getting thinner.  WHAT TO REMEMBER  Keep up with your usual exercises and follow other instructions given by your health care provider.   Take medicines as directed by your health care provider.    Keep your regular prenatal appointments.   Eat and drink lightly if you think you are going into labor.   If Braxton Hicks contractions are making you uncomfortable:   Change your position from lying down or resting to walking, or from walking to resting.   Sit and rest in a tub of warm water.   Drink 2-3 glasses of water. Dehydration may cause these contractions.   Do slow and deep breathing several times an hour.  WHEN SHOULD I SEEK IMMEDIATE MEDICAL CARE? Seek immediate medical care if:  Your contractions become stronger, more regular, and closer together.   You have fluid leaking or gushing from your vagina.   You have a fever.   You pass blood-tinged mucus.   You have vaginal bleeding.   You have continuous abdominal pain.   You have low back pain that you never had before.   You feel your baby's head pushing down and causing pelvic pressure.   Your baby is not moving as much as it used to.    This information is not intended to replace advice given to you by your health care provider. Make sure you discuss any questions you have with your health care provider.   Document Released: 02/01/2005 Document Revised: 02/06/2013 Document Reviewed: 11/13/2012 Elsevier Interactive Patient Education 2016 [place]???  You must attend a Doren Custard class at [facility]  3rd Wednesday of every month from [employer] by calling [phone] or online at [email or web address]  Bring [place] the certificate from the class  Waterbirth supplies needed for [facility]/Health Department patients:  Our practice has a Heritage manager in a Box tub at the hospital that you can borrow  You will need to purchase an accessory kit that has all needed supplies through [facility] [phone]) or online $175.00  Or you can purchase the supplies separately: o Single-use disposable  tub liner for [employer] in a Box (REGULAR size) o New garden hose labeled "lead-free", "suitable for drinking water", o Electric drain pump to remove water (We recommend 792 gallon per hour or greater pump.)  o  "non-toxic" OR "water potable" o Garden hose to remove the dirty water o Fish net o Bathing suit top (optional) o Long-handled mirror (optional)  [employer] sells tubs for ~ $120 if you would rather purchase your own tub.  They also sell accessories, liners.    Www.waterbirthsolutions.com for tub purchases and supplies  The Labor Ladies (www.thelaborladies.com) $275 for tub rental/set-up & take down/kit   [employer] information regarding doulas (labor support) who provide pool rentals:  IdentityList.se.htm   The Labor Ladies (www.thelaborladies.com)  IdentityList.se.htm  Things that would prevent you from having a waterbirth:  Premature, <37wks  Previous cesarean birth  Presence of thick meconium-stained fluid  Multiple gestation (Twins, triplets, etc.)  Uncontrolled diabetes or gestational diabetes requiring medication  Hypertension  Heavy vaginal bleeding  Non-reassuring fetal heart rate  Active infection (MRSA, etc.)  If your labor has to be induced and induction method requires continuous monitoring of the baby's heart rate  Other risks/issues identified by your obstetrical provider   AREA PEDIATRIC/FAMILY PRACTICE PHYSICIANS  [facility] [place] [address] [place], [place] [address] Phone - [phone]   Fax - [place] [address] [place], [place]  [address] Phone - [phone]   Fax - [phone]  [place]. [address] [place] [address] [place], [place]  [address] Phone - [phone]   Fax - [phone]  [facility] [address] [place], [place]  [address] Phone - [phone]   Fax - [phone]  [place] [address] [place], [place]  [address] Phone - [phone]   Fax - [place] [address] [place], [place]  [address] Phone - [phone]   Fax - [facility] [place] [address] [place], [place]  [address] Phone - [phone]   Fax - [phone]  [place] [address] [place], [place], [place]  [address] Phone - [phone]   Fax - [place] [address] [place], [place]  [address] Phone - [phone]   Fax - [phone] [facility] [place]. [address] [place], [place]  [address] Phone - [phone]   Fax - [phone]  [place] [address] [place], [place]  [address] Phone - [phone]   Fax - [phone]  [facility] [place], [place], [place]  [address] Phone - [phone]   Fax - [place] [address] [place], [place]  [address] Phone - [phone]   Fax - [phone]  [facility] [address], [place], [place]  [address] Phone - [place], [place]  [address] Phone - [phone]   Fax - [place] [address] [place], [place]  [address] Phone - [phone]   Fax - [phone]  [place] [address] [place], [place]  [address] Phone - [phone]   Fax - [place]. [place], [place]  [address] Phone - [phone]   Fax - [place], [place], [place]  [address] Phone - [phone]   Fax - [place] [address] [place], [place]   [address] Phone - [phone]   Fax - [phone]  [place] [address], [place], [place]  [address] Phone - [phone]   Fax - [place] [address] [place], [place]  [address] Phone - [phone]   Fax - [phone]  [place] [address] [place], [place]  [address] Phone - [phone]   Fax - [phone] [place] [address] [place], [place]  [address] Phone - [phone]   Fax - [phone]  [place], [place]  [address] Phone - [phone]   Fax - [place] [address] [place], [place]  [address] Phone - [phone]   Fax - [phone]

## 2015-07-16 NOTE — Progress Notes (Signed)
Tdap today Will schedule lab visit for 28 week labs

## 2015-07-16 NOTE — Progress Notes (Signed)
Subjective:  Melinda Burch is a 25 y.o. G2P0010 at 101w5d being seen today for ongoing prenatal care.  She is currently monitored for the following issues for this low-risk pregnancy and has ECZEMA; Supervision of normal pregnancy, antepartum; and Chlamydia infection affecting pregnancy in second trimester, antepartum on her problem list.  Patient reports no complaints.  Considering waterbirth.   Contractions: Not present. Vag. Bleeding: None.  Movement: Present. Denies leaking of fluid.   The following portions of the patient's history were reviewed and updated as appropriate: allergies, current medications, past family history, past medical history, past social history, past surgical history and problem list. Problem list updated.  Objective:   Filed Vitals:   07/16/15 1135  BP: 111/75  Pulse: 112  Weight: 134 lb 9.6 oz (61.054 kg)    Fetal Status: Fetal Heart Rate (bpm): 150 Fundal Height: 28 cm Movement: Present     General:  Alert, oriented and cooperative. Patient is in no acute distress.  Skin: Skin is warm and dry. No rash noted.   Cardiovascular: Normal heart rate noted  Respiratory: Normal respiratory effort, no problems with respiration noted  Abdomen: Soft, gravid, appropriate for gestational age. Pain/Pressure: Absent     Pelvic: Vag. Bleeding: None     Cervical exam deferred        Extremities: Normal range of motion.  Edema: None  Mental Status: Normal mood and affect. Normal behavior. Normal judgment and thought content.   Urinalysis: Urine Protein: Negative Urine Glucose: Negative  Assessment and Plan:  Pregnancy: G2P0010 at [redacted]w[redacted]d  1. Supervision of normal pregnancy, antepartum, second trimester - Tdap today - Plans to reschedule for third trimester labs - Given waterbirth information  2. Need for Tdap vaccination - Tdap (BOOSTRIX) injection 0.5 mL; Inject 0.5 mLs into the muscle once.  3. Chlamydia infection affecting pregnancy in second trimester,  antepartum - TOC next visit  Preterm labor symptoms and general obstetric precautions including but not limited to vaginal bleeding, contractions, leaking of fluid and fetal movement were reviewed in detail with the patient. Please refer to After Visit Summary for other counseling recommendations.  Return in about 2 days (around 07/18/2015) for for 1 hour GTT and labs.   Venia [place] Michiel Cowboy, CNM

## 2015-07-31 ENCOUNTER — Ambulatory Visit (INDEPENDENT_AMBULATORY_CARE_PROVIDER_SITE_OTHER): Payer: Medicaid Other | Admitting: Obstetrics & Gynecology

## 2015-07-31 VITALS — BP 112/79 | HR 131 | Wt 136.7 lb

## 2015-07-31 DIAGNOSIS — A5602 Chlamydial vulvovaginitis: Secondary | ICD-10-CM

## 2015-07-31 DIAGNOSIS — Z113 Encounter for screening for infections with a predominantly sexual mode of transmission: Secondary | ICD-10-CM

## 2015-07-31 DIAGNOSIS — A749 Chlamydial infection, unspecified: Secondary | ICD-10-CM

## 2015-07-31 DIAGNOSIS — O98313 Other infections with a predominantly sexual mode of transmission complicating pregnancy, third trimester: Secondary | ICD-10-CM

## 2015-07-31 DIAGNOSIS — Z3483 Encounter for supervision of other normal pregnancy, third trimester: Secondary | ICD-10-CM | POA: Diagnosis not present

## 2015-07-31 DIAGNOSIS — O98812 Other maternal infectious and parasitic diseases complicating pregnancy, second trimester: Secondary | ICD-10-CM

## 2015-07-31 LAB — POCT URINALYSIS DIP (DEVICE)
Bilirubin Urine: NEGATIVE
GLUCOSE, UA: NEGATIVE mg/dL
Hgb urine dipstick: NEGATIVE
KETONES UR: NEGATIVE mg/dL
Leukocytes, UA: NEGATIVE
Nitrite: NEGATIVE
PROTEIN: NEGATIVE mg/dL
Specific Gravity, Urine: 1.015 (ref 1.005–1.030)
UROBILINOGEN UA: 1 mg/dL (ref 0.0–1.0)
pH: 7 (ref 5.0–8.0)

## 2015-07-31 LAB — CBC
HCT: 31.2 % — ABNORMAL LOW (ref 35.0–45.0)
Hemoglobin: 10.1 g/dL — ABNORMAL LOW (ref 11.7–15.5)
MCH: 28.1 pg (ref 27.0–33.0)
MCHC: 32.4 g/dL (ref 32.0–36.0)
MCV: 86.9 fL (ref 80.0–100.0)
MPV: 10 fL (ref 7.5–12.5)
PLATELETS: 257 10*3/uL (ref 140–400)
RBC: 3.59 MIL/uL — AB (ref 3.80–5.10)
RDW: 13.4 % (ref 11.0–15.0)
WBC: 7.2 10*3/uL (ref 3.8–10.8)

## 2015-07-31 NOTE — Progress Notes (Signed)
TOC today off urine 28 wk labs today

## 2015-07-31 NOTE — Progress Notes (Signed)
Subjective:  Melinda Burch is a 25 y.o. G2P0010 at [redacted]w[redacted]d being seen today for ongoing prenatal care.  She is currently monitored for the following issues for this low-risk pregnancy and has ECZEMA; Supervision of normal pregnancy, antepartum; and Chlamydia infection affecting pregnancy in second trimester, antepartum on her problem list.  Patient reports no complaints.  Contractions: Not present. Vag. Bleeding: None.  Movement: Present. Denies leaking of fluid.   The following portions of the patient's history were reviewed and updated as appropriate: allergies, current medications, past family history, past medical history, past social history, past surgical history and problem list. Problem list updated.  Objective:   Filed Vitals:   07/31/15 0940  BP: 112/79  Pulse: 131  Weight: 136 lb 11.2 oz (62.007 kg)    Fetal Status: Fetal Heart Rate (bpm): 145 Fundal Height: 30 cm Movement: Present     General:  Alert, oriented and cooperative. Patient is in no acute distress.  Skin: Skin is warm and dry. No rash noted.   Cardiovascular: Normal heart rate noted  Respiratory: Normal respiratory effort, no problems with respiration noted  Abdomen: Soft, gravid, appropriate for gestational age. Pain/Pressure: Absent     Pelvic: Cervical exam deferred        Extremities: Normal range of motion.  Edema: None  Mental Status: Normal mood and affect. Normal behavior. Normal judgment and thought content.   Urinalysis:      Assessment and Plan:  Pregnancy: G2P0010 at [redacted]w[redacted]d  1. Supervision of normal pregnancy, antepartum, third trimester  - Glucose Tolerance, 1 HR (50g) w/o Fasting - RPR - HIV antibody (with reflex) - CBC - GC/Chlamydia probe amp ([place])not at [facility]  2. Chlamydia infection affecting pregnancy in second trimester, antepartum TOC today  Preterm labor symptoms and general obstetric precautions including but not limited to vaginal bleeding, contractions, leaking of fluid  and fetal movement were reviewed in detail with the patient. Please refer to After Visit Summary for other counseling recommendations.  Return in about 3 weeks (around 08/21/2015).   Woodroe Mode, MD

## 2015-07-31 NOTE — Patient Instructions (Signed)

## 2015-08-01 LAB — RPR

## 2015-08-01 LAB — HIV ANTIBODY (ROUTINE TESTING W REFLEX): HIV: NONREACTIVE

## 2015-08-02 LAB — GLUCOSE TOLERANCE, 1 HOUR (50G) W/O FASTING: Glucose, 1 Hr, gestational: 61 mg/dL — ABNORMAL LOW (ref <140)

## 2015-08-22 ENCOUNTER — Encounter: Payer: Medicaid Other | Admitting: Family Medicine

## 2015-08-25 ENCOUNTER — Other Ambulatory Visit (HOSPITAL_COMMUNITY)
Admission: RE | Admit: 2015-08-25 | Discharge: 2015-08-25 | Disposition: A | Discharge status: Home / Self Care | Payer: Medicaid Other | Source: Ambulatory Visit | Attending: Family Medicine | Admitting: Family Medicine

## 2015-08-25 ENCOUNTER — Ambulatory Visit (INDEPENDENT_AMBULATORY_CARE_PROVIDER_SITE_OTHER): Payer: Medicaid Other | Admitting: Obstetrics and Gynecology

## 2015-08-25 VITALS — BP 133/82 | HR 110 | Wt 140.0 lb

## 2015-08-25 DIAGNOSIS — B373 Candidiasis of vulva and vagina: Secondary | ICD-10-CM | POA: Diagnosis not present

## 2015-08-25 DIAGNOSIS — Z3483 Encounter for supervision of other normal pregnancy, third trimester: Secondary | ICD-10-CM | POA: Diagnosis present

## 2015-08-25 DIAGNOSIS — O98813 Other maternal infectious and parasitic diseases complicating pregnancy, third trimester: Secondary | ICD-10-CM | POA: Diagnosis not present

## 2015-08-25 DIAGNOSIS — O2243 Hemorrhoids in pregnancy, third trimester: Secondary | ICD-10-CM

## 2015-08-25 DIAGNOSIS — Z113 Encounter for screening for infections with a predominantly sexual mode of transmission: Secondary | ICD-10-CM

## 2015-08-25 DIAGNOSIS — O98312 Other infections with a predominantly sexual mode of transmission complicating pregnancy, second trimester: Secondary | ICD-10-CM

## 2015-08-25 DIAGNOSIS — K5900 Constipation, unspecified: Secondary | ICD-10-CM

## 2015-08-25 DIAGNOSIS — D126 Benign neoplasm of colon, unspecified: Secondary | ICD-10-CM | POA: Diagnosis not present

## 2015-08-25 DIAGNOSIS — B3731 Acute candidiasis of vulva and vagina: Secondary | ICD-10-CM

## 2015-08-25 DIAGNOSIS — K649 Unspecified hemorrhoids: Secondary | ICD-10-CM

## 2015-08-25 DIAGNOSIS — O98812 Other maternal infectious and parasitic diseases complicating pregnancy, second trimester: Secondary | ICD-10-CM

## 2015-08-25 DIAGNOSIS — A749 Chlamydial infection, unspecified: Secondary | ICD-10-CM

## 2015-08-25 MED ORDER — HYDROCORTISONE 1 % EX CREA: 1.0000 "application " | TOPICAL_CREAM | Freq: Two times a day (BID) | CUTANEOUS | Status: DC

## 2015-08-25 MED ORDER — MICONAZOLE NITRATE 2 % VA CREA: 1.0000 | TOPICAL_CREAM | Freq: Every day | VAGINAL | Status: DC

## 2015-08-25 MED ORDER — PSYLLIUM 58.6 % PO POWD: 1.0000 | Freq: Every day | ORAL | Status: DC

## 2015-08-25 NOTE — Progress Notes (Signed)
Subjective:  Melinda Burch is a 25 y.o. G2P0010 at [redacted]w[redacted]d being seen today for ongoing prenatal care.  She is currently monitored for the following issues for this low-risk pregnancy and has ECZEMA; Supervision of normal pregnancy, antepartum; and Chlamydia infection affecting pregnancy in second trimester, antepartum on her problem list.  Patient reports no complaints.   Contractions: Not present.  .  Movement: Present. Denies leaking of fluid.   The following portions of the patient's history were reviewed and updated as appropriate: allergies, current medications, past family history, past medical history, past social history, past surgical history and problem list. Problem list updated.  Objective:   Filed Vitals:   08/25/15 1042  BP: 133/82  Pulse: 123  Weight: 140 lb (63.504 kg)    Fetal Status: Fetal Heart Rate (bpm): 133   Movement: Present     General:  Alert, oriented and cooperative. Patient is in no acute distress.  Skin: Skin is warm and dry. No rash noted.   Cardiovascular: Normal heart rate noted  Respiratory: Normal respiratory effort, no problems with respiration noted  Abdomen: Soft, gravid, appropriate for gestational age. Pain/Pressure: Present     Pelvic:  Cervical exam deferred        Extremities: Normal range of motion.  Edema: None  Mental Status: Normal mood and affect. Normal behavior. Normal judgment and thought content.   Urinalysis:      Assessment and Plan:  Pregnancy: G2P0010 at [redacted]w[redacted]d  1. Supervision of normal pregnancy, antepartum, third trimester Routine care. 28wk labs negative  2. Chlamydia infection affecting pregnancy in second trimester, antepartum TOC today - GC/Chlamydia Probe Amp  3. Constipation, unspecified constipation type - psyllium (METAMUCIL) 58.6 % powder; Take 1 packet by mouth daily. Up to bid for constipation  Dispense: 283 g; Refill: 3  4. Hemorrhoids, unspecified hemorrhoid type None seen on GC/CT swab exam today -  hydrocortisone cream 1 %; Apply 1 application topically 2 (two) times daily.  Dispense: 30 g; Refill: 0  5. Vulvovaginal candidiasis - miconazole (MONISTAT 7) 2 % vaginal cream; Place 1 Applicatorful vaginally at bedtime.  Dispense: 45 g; Refill: 0  Preterm labor symptoms and general obstetric precautions including but not limited to vaginal bleeding, contractions, leaking of fluid and fetal movement were reviewed in detail with the patient. Please refer to After Visit Summary for other counseling recommendations.  Return in about 2 weeks (around 09/08/2015).   Aletha Halim, MD

## 2015-08-25 NOTE — Addendum Note (Signed)
Addended by: Shelly Coss on: 08/25/2015 05:07 PM   Modules accepted: Orders

## 2015-08-26 LAB — GC/CHLAMYDIA PROBE AMP (~~LOC~~) NOT AT ARMC
Chlamydia: NEGATIVE
NEISSERIA GONORRHEA: NEGATIVE

## 2015-09-16 ENCOUNTER — Encounter: Payer: Medicaid Other | Admitting: Family

## 2015-09-24 ENCOUNTER — Other Ambulatory Visit (HOSPITAL_COMMUNITY)
Admission: RE | Admit: 2015-09-24 | Discharge: 2015-09-24 | Disposition: A | Discharge status: Home / Self Care | Payer: Medicaid Other | Source: Ambulatory Visit | Attending: Family | Admitting: Family

## 2015-09-24 ENCOUNTER — Ambulatory Visit (INDEPENDENT_AMBULATORY_CARE_PROVIDER_SITE_OTHER): Payer: Medicaid Other | Admitting: Family Medicine

## 2015-09-24 VITALS — BP 116/82 | HR 120 | Wt 148.0 lb

## 2015-09-24 DIAGNOSIS — Z113 Encounter for screening for infections with a predominantly sexual mode of transmission: Secondary | ICD-10-CM | POA: Insufficient documentation

## 2015-09-24 DIAGNOSIS — Z3493 Encounter for supervision of normal pregnancy, unspecified, third trimester: Secondary | ICD-10-CM

## 2015-09-24 DIAGNOSIS — Z3483 Encounter for supervision of other normal pregnancy, third trimester: Secondary | ICD-10-CM

## 2015-09-24 DIAGNOSIS — A749 Chlamydial infection, unspecified: Secondary | ICD-10-CM

## 2015-09-24 DIAGNOSIS — O98312 Other infections with a predominantly sexual mode of transmission complicating pregnancy, second trimester: Secondary | ICD-10-CM

## 2015-09-24 DIAGNOSIS — O98812 Other maternal infectious and parasitic diseases complicating pregnancy, second trimester: Secondary | ICD-10-CM

## 2015-09-24 LAB — POCT URINALYSIS DIP (DEVICE)
BILIRUBIN URINE: NEGATIVE
Glucose, UA: NEGATIVE mg/dL
HGB URINE DIPSTICK: NEGATIVE
KETONES UR: NEGATIVE mg/dL
Nitrite: NEGATIVE
PH: 6.5 (ref 5.0–8.0)
Protein, ur: NEGATIVE mg/dL
Specific Gravity, Urine: 1.01 (ref 1.005–1.030)
Urobilinogen, UA: 1 mg/dL (ref 0.0–1.0)

## 2015-09-24 LAB — OB RESULTS CONSOLE GC/CHLAMYDIA: Gonorrhea: NEGATIVE

## 2015-09-24 LAB — OB RESULTS CONSOLE GBS: STREP GROUP B AG: NEGATIVE

## 2015-09-24 NOTE — Patient Instructions (Signed)

## 2015-09-24 NOTE — Progress Notes (Signed)
Subjective:  Melinda Burch is a 25 y.o. G2P0010 at [redacted]w[redacted]d being seen today for ongoing prenatal care.  She is currently monitored for the following issues for this low-risk pregnancy and has ECZEMA; Supervision of normal pregnancy, antepartum; and Chlamydia infection affecting pregnancy in second trimester, antepartum on her problem list.  Patient reports backache and nausea.  Contractions: Not present. Vag. Bleeding: None.  Movement: Present. Denies leaking of fluid.   The following portions of the patient's history were reviewed and updated as appropriate: allergies, current medications, past family history, past medical history, past social history, past surgical history and problem list. Problem list updated.  Objective:   Vitals:   09/24/15 0812  BP: 116/82  Pulse: (!) 120  Weight: 148 lb (67.1 kg)    Fetal Status: Fetal Heart Rate (bpm): 131   Movement: Present     General:  Alert, oriented and cooperative. Patient is in no acute distress.  Skin: Skin is warm and dry. No rash noted.   Cardiovascular: Normal heart rate noted  Respiratory: Normal respiratory effort, no problems with respiration noted  Abdomen: Soft, gravid, appropriate for gestational age. Pain/Pressure: Present     Pelvic:  Cervical exam performed; closed/thick/high and ballotable      ; Vertex  Extremities: Normal range of motion.  Edema: None  Mental Status: Normal mood and affect. Normal behavior. Normal judgment and thought content.   Urinalysis: Urine Protein: Negative Urine Glucose: Negative  Assessment and Plan:  Pregnancy: G2P0010 at [redacted]w[redacted]d  1. Supervision of normal pregnancy in third trimester - Culture, beta strep (group b only) - GC/Chlamydia probe amp ([place])not at [facility]  2. Chlamydia infection affecting pregnancy in second trimester, antepartum   Term labor symptoms and general obstetric precautions including but not limited to vaginal bleeding, contractions, leaking of fluid and fetal  movement were reviewed in detail with the patient. Please refer to After Visit Summary for other counseling recommendations.  Return in 1 week (on 10/01/2015).   Isaias Sakai, DO  OB Fellow [facility]

## 2015-09-25 LAB — GC/CHLAMYDIA PROBE AMP (~~LOC~~) NOT AT ARMC
Chlamydia: NEGATIVE
Neisseria Gonorrhea: NEGATIVE

## 2015-09-26 LAB — CULTURE, BETA STREP (GROUP B ONLY)

## 2015-10-02 ENCOUNTER — Encounter: Payer: Medicaid Other | Admitting: Family Medicine

## 2015-10-09 ENCOUNTER — Ambulatory Visit (INDEPENDENT_AMBULATORY_CARE_PROVIDER_SITE_OTHER): Payer: Medicaid Other | Admitting: Family

## 2015-10-09 DIAGNOSIS — Z3483 Encounter for supervision of other normal pregnancy, third trimester: Secondary | ICD-10-CM

## 2015-10-09 LAB — POCT URINALYSIS DIP (DEVICE)
Bilirubin Urine: NEGATIVE
GLUCOSE, UA: NEGATIVE mg/dL
Hgb urine dipstick: NEGATIVE
Ketones, ur: NEGATIVE mg/dL
NITRITE: NEGATIVE
PROTEIN: NEGATIVE mg/dL
Specific Gravity, Urine: 1.015 (ref 1.005–1.030)
UROBILINOGEN UA: 0.2 mg/dL (ref 0.0–1.0)
pH: 6 (ref 5.0–8.0)

## 2015-10-09 NOTE — Progress Notes (Signed)
Subjective:  Melinda Burch is a 25 y.o. G2P0010 at [redacted]w[redacted]d being seen today for ongoing prenatal care.  She is currently monitored for the following issues for this low-risk pregnancy and has ECZEMA; Supervision of normal pregnancy, antepartum; and Chlamydia infection affecting pregnancy in second trimester, antepartum on her problem list.  Patient reports occasional contractions.  Contractions: Not present.  .  Movement: Present. Denies leaking of fluid.   The following portions of the patient's history were reviewed and updated as appropriate: allergies, current medications, past family history, past medical history, past social history, past surgical history and problem list. Problem list updated.  Objective:   Vitals:   10/09/15 1026  BP: 124/83  Pulse: (!) 119  Weight: 151 lb (68.5 kg)    Fetal Status: Fetal Heart Rate (bpm): 145 Fundal Height: 37 cm Movement: Present  Presentation: Vertex  General:  Alert, oriented and cooperative. Patient is in no acute distress.  Skin: Skin is warm and dry. No rash noted.   Cardiovascular: Normal heart rate noted  Respiratory: Normal respiratory effort, no problems with respiration noted  Abdomen: Soft, gravid, appropriate for gestational age. Pain/Pressure: Present     Pelvic:  Cervical exam deferred Dilation: Closed Effacement (%): Thick    Extremities: Normal range of motion.  Edema: None  Mental Status: Normal mood and affect. Normal behavior. Normal judgment and thought content.   Urinalysis: Urine Protein: Negative Urine Glucose: Negative  Assessment and Plan:  Pregnancy: G2P0010 at [redacted]w[redacted]d  1. Supervision of normal pregnancy, antepartum, third trimester - Return for NST on Monday - Patient to return for outpatient Foley Bulb on 10/16/15; given patient instructions - IOL scheduled for 10/17/15  Term labor symptoms and general obstetric precautions including but not limited to vaginal bleeding, contractions, leaking of fluid and fetal  movement were reviewed in detail with the patient. Please refer to After Visit Summary for other counseling recommendations.  Return in about 4 days (around 10/13/2015) for NST Monday; Foley Bulb 10/16/15 3pm; and schedule IOL 10/17/15.   Venia Carbon Michiel Cowboy, CNM

## 2015-10-10 ENCOUNTER — Telehealth (HOSPITAL_COMMUNITY): Payer: Self-pay | Admitting: *Deleted

## 2015-10-10 NOTE — Telephone Encounter (Signed)
Preadmission screen  

## 2015-10-13 ENCOUNTER — Ambulatory Visit (INDEPENDENT_AMBULATORY_CARE_PROVIDER_SITE_OTHER): Payer: Medicaid Other | Admitting: Obstetrics and Gynecology

## 2015-10-13 VITALS — BP 121/83 | HR 101

## 2015-10-13 DIAGNOSIS — O480 Post-term pregnancy: Secondary | ICD-10-CM

## 2015-10-13 DIAGNOSIS — Z36 Encounter for antenatal screening of mother: Secondary | ICD-10-CM | POA: Diagnosis not present

## 2015-10-13 NOTE — Progress Notes (Signed)
NST reactive today. AFI 21.1 Will continue with fetal testing

## 2015-10-14 ENCOUNTER — Encounter (HOSPITAL_COMMUNITY): Payer: Self-pay | Admitting: *Deleted

## 2015-10-14 ENCOUNTER — Inpatient Hospital Stay (HOSPITAL_COMMUNITY)
Admission: AD | Admit: 2015-10-14 | Discharge: 2015-10-16 | DRG: 775 | Disposition: A | Discharge status: Home / Self Care | Payer: Medicaid Other | Source: Ambulatory Visit | Attending: Family Medicine | Admitting: Family Medicine

## 2015-10-14 DIAGNOSIS — O99344 Other mental disorders complicating childbirth: Secondary | ICD-10-CM | POA: Diagnosis present

## 2015-10-14 DIAGNOSIS — Z3A40 40 weeks gestation of pregnancy: Secondary | ICD-10-CM | POA: Diagnosis not present

## 2015-10-14 DIAGNOSIS — O4202 Full-term premature rupture of membranes, onset of labor within 24 hours of rupture: Secondary | ICD-10-CM | POA: Diagnosis present

## 2015-10-14 DIAGNOSIS — F419 Anxiety disorder, unspecified: Secondary | ICD-10-CM | POA: Diagnosis present

## 2015-10-14 DIAGNOSIS — O4290 Premature rupture of membranes, unspecified as to length of time between rupture and onset of labor, unspecified weeks of gestation: Secondary | ICD-10-CM | POA: Diagnosis present

## 2015-10-14 DIAGNOSIS — Z3483 Encounter for supervision of other normal pregnancy, third trimester: Secondary | ICD-10-CM

## 2015-10-14 LAB — CBC
HEMATOCRIT: 27.8 % — AB (ref 36.0–46.0)
HEMATOCRIT: 27.6 % — AB (ref 36.0–46.0)
HEMOGLOBIN: 9.3 g/dL — AB (ref 12.0–15.0)
Hemoglobin: 9.2 g/dL — ABNORMAL LOW (ref 12.0–15.0)
MCH: 24.7 pg — AB (ref 26.0–34.0)
MCH: 24.6 pg — ABNORMAL LOW (ref 26.0–34.0)
MCHC: 33.5 g/dL (ref 30.0–36.0)
MCHC: 33.3 g/dL (ref 30.0–36.0)
MCV: 73.9 fL — AB (ref 78.0–100.0)
MCV: 73.8 fL — AB (ref 78.0–100.0)
Platelets: 295 10*3/uL (ref 150–400)
Platelets: 303 10*3/uL (ref 150–400)
RBC: 3.76 MIL/uL — AB (ref 3.87–5.11)
RBC: 3.74 MIL/uL — ABNORMAL LOW (ref 3.87–5.11)
RDW: 15.3 % (ref 11.5–15.5)
RDW: 15.3 % (ref 11.5–15.5)
WBC: 8.7 10*3/uL (ref 4.0–10.5)
WBC: 18 10*3/uL — AB (ref 4.0–10.5)

## 2015-10-14 LAB — TYPE AND SCREEN
ABO/RH(D): A POS
Antibody Screen: NEGATIVE

## 2015-10-14 LAB — RPR: RPR: NONREACTIVE

## 2015-10-14 LAB — ABO/RH: ABO/RH(D): A POS

## 2015-10-14 MED ORDER — TERBUTALINE SULFATE 1 MG/ML IJ SOLN
0.2500 mg | Freq: Once | INTRAMUSCULAR | Status: DC | PRN
  Filled 2015-10-14: qty 1

## 2015-10-14 MED ORDER — ZOLPIDEM TARTRATE 5 MG PO TABS: 5.0000 mg | ORAL_TABLET | Freq: Every evening | ORAL | Status: DC | PRN

## 2015-10-14 MED ORDER — DIBUCAINE 1 % RE OINT
1.0000 | TOPICAL_OINTMENT | RECTAL | Status: DC | PRN
Start: 2015-10-14 — End: 2015-10-16
  Filled 2015-10-14: qty 28

## 2015-10-14 MED ORDER — ACETAMINOPHEN 325 MG PO TABS: 650.0000 mg | ORAL_TABLET | ORAL | Status: DC | PRN

## 2015-10-14 MED ORDER — LACTATED RINGERS IV SOLN
INTRAVENOUS | Status: DC
  Administered 2015-10-14 (×2): via INTRAVENOUS

## 2015-10-14 MED ORDER — LACTATED RINGERS IV SOLN: 500.0000 mL | INTRAVENOUS | Status: DC | PRN

## 2015-10-14 MED ORDER — COCONUT OIL OIL: 1.0000 "application " | TOPICAL_OIL | Status: DC | PRN

## 2015-10-14 MED ORDER — ONDANSETRON HCL 4 MG/2ML IJ SOLN: 4.0000 mg | INTRAMUSCULAR | Status: DC | PRN

## 2015-10-14 MED ORDER — FENTANYL CITRATE (PF) 100 MCG/2ML IJ SOLN
100.0000 ug | INTRAMUSCULAR | Status: DC | PRN
  Administered 2015-10-14 (×4): 100 ug via INTRAVENOUS
  Filled 2015-10-14 (×4): qty 2

## 2015-10-14 MED ORDER — SIMETHICONE 80 MG PO CHEW: 80.0000 mg | CHEWABLE_TABLET | ORAL | Status: DC | PRN

## 2015-10-14 MED ORDER — DIPHENHYDRAMINE HCL 25 MG PO CAPS: 25.0000 mg | ORAL_CAPSULE | Freq: Four times a day (QID) | ORAL | Status: DC | PRN

## 2015-10-14 MED ORDER — SOD CITRATE-CITRIC ACID 500-334 MG/5ML PO SOLN: 30.0000 mL | ORAL | Status: DC | PRN

## 2015-10-14 MED ORDER — SENNOSIDES-DOCUSATE SODIUM 8.6-50 MG PO TABS
2.0000 | ORAL_TABLET | ORAL | Status: DC
  Administered 2015-10-15 (×2): 2 via ORAL
  Filled 2015-10-14 (×2): qty 2

## 2015-10-14 MED ORDER — OXYTOCIN 40 UNITS IN LACTATED RINGERS INFUSION - SIMPLE MED
2.5000 [IU]/h | INTRAVENOUS | Status: DC
  Filled 2015-10-14: qty 1000

## 2015-10-14 MED ORDER — BENZOCAINE-MENTHOL 20-0.5 % EX AERO
1.0000 | INHALATION_SPRAY | CUTANEOUS | Status: DC | PRN
Start: 2015-10-14 — End: 2015-10-16
  Administered 2015-10-14: 1 via TOPICAL
  Filled 2015-10-14: qty 56

## 2015-10-14 MED ORDER — WITCH HAZEL-GLYCERIN EX PADS: 1.0000 "application " | MEDICATED_PAD | CUTANEOUS | Status: DC | PRN

## 2015-10-14 MED ORDER — OXYCODONE-ACETAMINOPHEN 5-325 MG PO TABS: 1.0000 | ORAL_TABLET | ORAL | Status: DC | PRN

## 2015-10-14 MED ORDER — MISOPROSTOL 200 MCG PO TABS
50.0000 ug | ORAL_TABLET | ORAL | Status: DC | PRN
  Administered 2015-10-14: 50 ug via ORAL
  Filled 2015-10-14: qty 0.5

## 2015-10-14 MED ORDER — ONDANSETRON HCL 4 MG PO TABS: 4.0000 mg | ORAL_TABLET | ORAL | Status: DC | PRN

## 2015-10-14 MED ORDER — OXYCODONE-ACETAMINOPHEN 5-325 MG PO TABS: 2.0000 | ORAL_TABLET | ORAL | Status: DC | PRN

## 2015-10-14 MED ORDER — TETANUS-DIPHTH-ACELL PERTUSSIS 5-2.5-18.5 LF-MCG/0.5 IM SUSP: 0.5000 mL | Freq: Once | INTRAMUSCULAR | Status: DC

## 2015-10-14 MED ORDER — LIDOCAINE HCL (PF) 1 % IJ SOLN
30.0000 mL | INTRAMUSCULAR | Status: DC | PRN
  Filled 2015-10-14: qty 30

## 2015-10-14 MED ORDER — OXYTOCIN BOLUS FROM INFUSION
500.0000 mL | Freq: Once | INTRAVENOUS | Status: AC
  Administered 2015-10-14: 500 mL via INTRAVENOUS

## 2015-10-14 MED ORDER — ONDANSETRON HCL 4 MG/2ML IJ SOLN: 4.0000 mg | Freq: Four times a day (QID) | INTRAMUSCULAR | Status: DC | PRN

## 2015-10-14 MED ORDER — IBUPROFEN 600 MG PO TABS
600.0000 mg | ORAL_TABLET | Freq: Four times a day (QID) | ORAL | Status: DC
  Administered 2015-10-14 – 2015-10-16 (×5): 600 mg via ORAL
  Filled 2015-10-14 (×8): qty 1

## 2015-10-14 MED ORDER — PRENATAL MULTIVITAMIN CH
1.0000 | ORAL_TABLET | Freq: Every day | ORAL | Status: DC
  Administered 2015-10-15 – 2015-10-16 (×2): 1 via ORAL
  Filled 2015-10-14 (×2): qty 1

## 2015-10-14 MED ORDER — LACTATED RINGERS IV BOLUS (SEPSIS)
1000.0000 mL | Freq: Once | INTRAVENOUS | Status: AC
  Administered 2015-10-14: 1000 mL via INTRAVENOUS

## 2015-10-14 NOTE — Anesthesia Pain Management Evaluation Note (Signed)
  CRNA Pain Management Visit Note  Patient: Melinda Burch, 25 y.o., female  "Hello I am a member of the anesthesia team at [facility]. We have an anesthesia team available at all times to provide care throughout the hospital, including epidural management and anesthesia for C-section. I don't know your plan for the delivery whether it a natural birth, water birth, IV sedation, nitrous supplementation, doula or epidural, but we want to meet your pain goals."   1.Was your pain managed to your expectations on prior hospitalizations?   No prior hospitalizations  2.What is your expectation for pain management during this hospitalization?     Epidural and IV pain meds  3.How can we help you reach that goal?  Explain options for labor  Record the patient's initial score and the patient's pain goal.   Pain: 1  Pain Goal: 9 The [facility] wants you to be able to say your pain was always managed very well.  Melinda Burch 10/14/2015

## 2015-10-14 NOTE — Progress Notes (Signed)
Labor Progress Note Katelynd Muraoka is a 25 y.o. G2P0010 at [redacted]w[redacted]d presented for PROM @ 12:30 AM  S: Patient doing well. Dozing off at times. States she feels the contractions but not to bad.   O:  BP 121/89 (BP Location: Left Arm)   Pulse 96   Temp 98.8 F (37.1 C) (Axillary)   Resp 18   Ht 5\' 4"  (1.626 m)   Wt 68 kg (150 lb)   LMP 12/17/2014   BMI 25.75 kg/m  EFM: 140/moderate/accels present  CVE: Dilation: 1.5 Effacement (%): 80 Station: -2 Presentation: Vertex Exam by:: Tyja Gortney   A&P: 25 y.o. G2P0010 [redacted]w[redacted]d presented for PROM @ 12:30 AM. Expectant management initially, however will augment labor now per discussion with patient.    #Labor: Foley Bulb + oral Cytotec  #Pain: None for now #FWB: Category 1  #GBS negative   Roby Spalla Criss Rosales, MD 6:49 AM

## 2015-10-14 NOTE — H&P (Signed)
LABOR ADMISSION HISTORY AND PHYSICAL  Melinda Burch is a 25 y.o. female G2P0010 with IUP at [redacted]w[redacted]d by 8w u/s  presenting for PROM @ 12:30 AM. She reports +FM, + contractions, no VB, no headaches or RUQ pain. Positive for slight peripheral edema.  She plans on breast feeding. She request undecided for birth control.  Dating: By 8 week u/s --->  Estimated Date of Delivery: 10/10/15  Sono:    @[redacted]w[redacted]d , CWD, normal anatomy, cephalic presentation, 0000000 g, 51 % EFW   Prenatal History/Complications:  Past Medical History: Eczema and PMHx of chlamydia during 2nd trimester   Past Surgical History: Past Surgical History:  Procedure Laterality Date  . DILATION AND CURETTAGE OF UTERUS      Obstetrical History: OB History    Gravida Para Term Preterm AB Living   2       1     SAB TAB Ectopic Multiple Live Births     1            Social History: Social History   Social History  . Marital status: Married    Spouse name: N/A  . Number of children: N/A  . Years of education: N/A   Social History Main Topics  . Smoking status: Never Smoker  . Smokeless tobacco: Never Used  . Alcohol use No  . Drug use: No  . Sexual activity: Yes   Other Topics Concern  . None   Social History Narrative  . None    Family History: No family history on file.  Allergies: No Known Allergies  Prescriptions Prior to Admission  Medication Sig Dispense Refill Last Dose  . cetirizine (ZYRTEC) 10 MG tablet Take 10 mg by mouth daily as needed for allergies. Reported on 08/25/2015   Taking     Review of Systems   All systems reviewed and negative except as stated in HPI  BP 121/89 (BP Location: Left Arm)   Pulse 96   Temp 98.6 F (37 C) (Oral)   Resp 20   LMP 12/17/2014  General appearance: alert and cooperative Abdomen: soft, non-tender; bowel sounds normal Extremities: Homans sign is negative, no sign of DVT, edema Presentation: cephalic Fetal monitoringBaseline: 140 bpm, Variability:  Good {> 6 bpm), Accelerations: Reactive and Decelerations: Absent Uterine activityFrequency: Irregular      Prenatal labs: ABO, Rh: A/POS/-- (05/05 [id number]) Antibody: NEG (05/05 0923) Rubella: Immune RPR: NON REAC (06/15 1420)  HBsAg: NEGATIVE (05/05 0923)  HIV: NONREACTIVE (06/15 1420)  GBS: Negative (08/09 0000)  1 hr Glucola 61 Genetic screening : None, late to care  Anatomy US: wnl   Prenatal Transfer Tool  Maternal Diabetes: No Genetic Screening: Declined Maternal Ultrasounds/Referrals: Normal Fetal Ultrasounds or other Referrals:  None Maternal Substance Abuse:  No Significant Maternal Medications:  None Significant Maternal Lab Results: None  No results found for this or any previous visit (from the past 24 hour(s)).  Patient Active Problem List   Diagnosis Date Noted  . Chlamydia infection affecting pregnancy in second trimester, antepartum 06/23/2015  . Supervision of normal pregnancy, antepartum 06/05/2015  . ECZEMA 10/07/2007    Assessment: Melinda Burch is a 25 y.o. G2P0010 at [redacted]w[redacted]d here for PROM @ 12:30 AM   #Labor:Expectant management for now  #Pain: Possibly Epidural  #FWB: Category 1 #ID: Negative  #MOF: Breast #MOC:Undecided  #Circ:  outpat circ   Melinda Cletis Media, MD  Midwife attestation: I have seen and examined this patient; I agree with above documentation in the resident's  note.   Melinda Burch is a 25 y.o. G2P1011 here for PROM possibly in early labor. PE: BP 115/78 (BP Location: Right Arm)   Pulse (!) 110   Temp 98.5 F (36.9 C) (Oral)   Resp 20   Ht 5\' 4"  (1.626 m)   Wt 150 lb (68 kg)   LMP 12/17/2014   SpO2 98%   Breastfeeding? Unknown   BMI 25.75 kg/m  Gen: calm comfortable, NAD Resp: normal effort, no distress Abd: gravid  ROS, labs, PMH reviewed  Plan: Admit to LD Labor: expectant management, reevaluate in 4 hours  FWB: Category I ID: GBS negative  LEFTWICH-KIRBY, LISA, CNM  10/17/2015, 8:28 AM

## 2015-10-14 NOTE — Plan of Care (Signed)
Problem: Health Behavior/Discharge Planning: Goal: Ability to manage health-related needs will improve Outcome: Not Progressing Please refer to progress note regarding physical regulation

## 2015-10-14 NOTE — Progress Notes (Signed)
Went to evaluate patient for elevated heart rate per nursing. Patient with recent anxiety between her and her partner. Per nursing patient was arguing with husband about issues of paternity. Patient denies any SOB, chest pain, chills, pain in her legs.    10/14/15 1859     BP: 118/79  Pulse: (!) 136  Resp: 18  Temp: 100 F (37.8 C)   Physical Exam  Lungs:CTAB, no rhonchi, no respiratory distress, RR 14  Heart: tachycardic,regular rhythm, no murmurs  Legs: No swelling, negative Homans' sign   CBC    Component Value Date/Time   WBC 18.0 (H) 10/14/2015 1925   RBC 3.74 (L) 10/14/2015 1925   HGB 9.2 (L) 10/14/2015 1925   HCT 27.6 (L) 10/14/2015 1925   PLT 303 10/14/2015 1925   MCV 73.8 (L) 10/14/2015 1925   MCH 24.6 (L) 10/14/2015 1925   MCHC 33.3 10/14/2015 1925   RDW 15.3 10/14/2015 1925   LYMPHSABS 1,260 06/20/2015 0923   MONOABS 490 06/20/2015 0923   EOSABS 140 06/20/2015 0923   BASOSABS 0 06/20/2015 0923    A/P Diff includes anemia, arrhthymia, PE, infection. Hgb unchanged predelivery, no significant bleeding noted. Patient denies dizziness or unsteadiness on feet.  No signs of infection, no elevated temps, no chills, leukocytosis likely due to recent vaginal delivery. Patient is well appearing. Pulse oximetry 100%, no signs of DVT, patient denies SOB. EKG wnl. Upon chart review patient with several prenatal visits with elevated pulse in the 110s -120s. Therefore, patient likely with sinus tachycardia at baseline. In addition, patient with anxiety and stressing associated with partner likely contributing to elevated pulse

## 2015-10-14 NOTE — MAU Note (Signed)
PT  SAYS SROM  AT [place].  VE IN   CLINIC  - SOFT.    DENIES HSV AND MRSA.   GBS- NEG

## 2015-10-14 NOTE — Plan of Care (Signed)
Problem: Physical Regulation: Goal: Ability to maintain clinical measurements within normal limits will improve At 1657, during patient's routine reassessment apical pulse 127, regular; RR 20; Oral temperature 99.8; BP 133 81, sitting. Patient reported fatique, but denied shortness of breath or chest pain. Lungs CTA bilaterally. FF, ML, U/1. Small amount of lochia rubra. Patient voided spontaneously quantity sufficient. Patient reports history of anxiety. Denies any other discomfort at the time.  At 1705, after resting in bed a short while, pulse 136, regular, RR 20. Ox saturation 100%. Patient was placed on continuous pulse oximeter.  At 1729 patient's remains tachycardic, pulse ranging in the 120's and 130s. Oxygen saturation levels ranging from 98-100%. Dr. Shawna Orleans informed. No orders received at that time. By 1840, patient's pulse ranging between 120's and 140's. Oxygen saturation levels remaining 98-100%. Dr. Shawna Orleans informed. Plan to administer IV bolus and draw blood for CBC. The patient was reassessed at [place]. Her pulse ranging from 120's to 150's. Oxygen saturation levels remaining stable between 98-100%. RR 20. Oral temperature 100.0.  Attempt to flush patient's NSL to initiate IV bolus was unsuccessful, as the site was found to be occluded and discontinued. Plans to restart the NSL were made.  By [id number], I informed Dr. Baron Sane that there was a delay in starting the IV bolus due to the fact that the NSL site was occluded, but that plans were underway to restart a NSL. I also updated him on the patient's vital signs and temperature and informed him that there was a great bit of tension between the patient and her husband over the paternity of the infant and that the patient was upset with her husband and asking to be left alone. The husband was refusing to leave stating that he had a right to be there even though he was not the father of this baby. Dr. Baron Sane stated that he would be coming to see the patient.  He requested that a 12-lead EKG be done prior to his arrival.

## 2015-10-14 NOTE — Lactation Note (Signed)
This note was copied from a baby's chart. Lactation Consultation Note Attempted visit at 9 hours of age.  Parents asleep and baby in crib asleep.  LC encouraged mom to call for assist at next feeding.   Patient Name: Boy Mareta Mervis [id number] Date: 10/14/2015     Maternal Data    Feeding Feeding Type: Breast Fed Length of feed: 20 min  LATCH Score/Interventions Latch: Repeated attempts needed to sustain latch, nipple held in mouth throughout feeding, stimulation needed to elicit sucking reflex. Intervention(s): Adjust position;Assist with latch;Breast compression  Audible Swallowing: Spontaneous and intermittent  Type of Nipple: Everted at rest and after stimulation  Comfort (Breast/Nipple): Soft / non-tender     Hold (Positioning): Assistance needed to correctly position infant at breast and maintain latch. Intervention(s): Breastfeeding basics reviewed;Position options;Support Pillows;Skin to skin  LATCH Score: 8  Lactation Tools Discussed/Used     Consult Status      [place], Justine Null 10/14/2015, 10:49 PM

## 2015-10-15 NOTE — Lactation Note (Addendum)
This note was copied from a baby's chart. Lactation Consultation Note New mom has long pendulum breast, w/large everted nipples. Hand expression taught w/easily expressed colostrum. Baby BF in football hold. Baby under breast. repositioned baby, taught body alignment. Referred to Baby and Me Book in Breastfeeding section Pg. 22-23 for position options and Proper latch demonstration. Mom kept pulling back in breast tissue and jiggling baby to keep constantly feeding. Educated about newborn behavior, feeding patterns, I&O, STS, cluster feeding, supply and demand. [place] brochure given w/resources, support groups and [place] services. Patient Name: Melinda Burch [id number] Date: 10/15/2015 Reason for consult: Initial assessment   Maternal Data Has patient been taught Hand Expression?: Yes Does the patient have breastfeeding experience prior to this delivery?: No  Feeding Feeding Type: Breast Fed Length of feed: 20 min (still BF)  LATCH Score/Interventions Latch: Repeated attempts needed to sustain latch, nipple held in mouth throughout feeding, stimulation needed to elicit sucking reflex. Intervention(s): Adjust position;Assist with latch;Breast massage;Breast compression  Audible Swallowing: Spontaneous and intermittent  Type of Nipple: Everted at rest and after stimulation  Comfort (Breast/Nipple): Soft / non-tender     Hold (Positioning): Assistance needed to correctly position infant at breast and maintain latch. Intervention(s): Breastfeeding basics reviewed;Support Pillows;Position options;Skin to skin  LATCH Score: 8  Lactation Tools Discussed/Used WIC Program: No   Consult Status Consult Status: Follow-up Date: 10/16/15 Follow-up type: In-patient    Eeva Schlosser, Elta Gu[place] 10/15/2015, 1:28 AM
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
## 2015-10-15 NOTE — Progress Notes (Signed)
Post Partum Day 1 Subjective: no complaints, up ad lib, voiding, tolerating PO and + flatus though no postpartum BM yet. Has seen lactation and is doing well with breastfeeding.   Objective: Blood pressure (!) 106/57, pulse 100, temperature 98.9 F (37.2 C), temperature source Oral, resp. rate 18, height 5\' 4"  (1.626 m), weight 68 kg (150 lb), last menstrual period 12/17/2014, SpO2 98 %, not currently breastfeeding.  Physical Exam:  General: alert, cooperative and no distress Lochia: appropriate Uterine Fundus: firm DVT Evaluation: No evidence of DVT seen on physical exam.   Recent Labs  10/14/15 0205 10/14/15 1925  HGB 9.3* 9.2*  HCT 27.8* 27.6*    Assessment/Plan: Plan for discharge tomorrow pending social work consult. Breastfeeding well. Circumcision on an outpatient basis. Plans to use progestin-only pills for contraception.    LOS: 1 day   Lorenza Evangelist 10/15/2015, 7:11 AM

## 2015-10-15 NOTE — Progress Notes (Addendum)
Post Partum Day 1  Subjective:  Melinda Burch is a 25 y.o. G2P1011 [redacted]w[redacted]d s/p SVD.  Elevated heart rate overnight likely due to anxiety.   Pt denies problems with ambulating, voiding or po intake.  She denies nausea or vomiting.  Pain is well controlled.  She has had flatus. She has not had bowel movement.  Lochia Minimal.  Plan for birth control is oral contraceptives (estrogen/progesterone).  Method of Feeding: Breast   Objective: BP (!) 106/57 (BP Location: Right Arm)   Pulse 100   Temp 98.9 F (37.2 C) (Oral)   Resp 18   Ht 5\' 4"  (1.626 m)   Wt 68 kg (150 lb)   LMP 12/17/2014   SpO2 98%   Breastfeeding? Unknown   BMI 25.75 kg/m   Physical Exam:  General: alert, cooperative and no distress Lochia:normal flow Abdomen: +BS, soft, nontender, fundus firm at/below umbilicus Uterine Fundus: firm,  DVT Evaluation: No evidence of DVT seen on physical exam. Extremities: no edema   Recent Labs  10/14/15 0205 10/14/15 1925  HGB 9.3* 9.2*  HCT 27.8* 27.6*    Assessment/Plan:  ASSESSMENT: Melinda Burch is a 25 y.o. G2P1011 [redacted]w[redacted]d ppd #1 s/p NSVD/VAVD/FAVD doing well.   Tachycardic overnight, now resolved likely due to anxiety. Patient arguing with husband earlier in the night.  Social work consult placed  Plan for discharge tomorrow   LOS: 1 day   [place] 10/15/2015, 7:49 AM    OB FELLOW POSTPARTUM PROGRESS NOTE ATTESTATION  I have seen and examined this patient and agree with above documentation in the resident's note.   Bakary Bramer, DO 10:03 AM

## 2015-10-16 ENCOUNTER — Other Ambulatory Visit: Payer: Medicaid Other | Admitting: Obstetrics and Gynecology

## 2015-10-16 MED ORDER — IBUPROFEN 600 MG PO TABS: 600.0000 mg | ORAL_TABLET | Freq: Four times a day (QID) | ORAL | 0 refills | Status: DC | PRN

## 2015-10-16 NOTE — Lactation Note (Signed)
This note was copied from a baby's chart. Lactation Consultation Note  Patient Name: Melinda Burch [id number] Date: 10/16/2015 Reason for consult: Follow-up assessment;Infant weight loss;Other (Comment) (per mom baby recently finished breast feeding . LC updated doc flow sheets 5% weight loss . S.Bili at 43 hours - 7.9 )  Baby is 15 hours old and has been consistent at the breast and mom feels breast feeding is going really well , denies nipple soreness and breast are getting fuller with some leakage.  Sore nipple and engorgement prevention and tx reviewed. Referring to the Baby and me booklet pages 24-25.  LC instructed mom on the use hand pump , #24 Flange good fit for today, but will probably need to  Be increased when  Milk comes in #27 provided. Per mo active with [facility] and will contact them when she will be returning to work.  Mom receptive to teaching. Gautier praised her for her breast feeding efforts.  Mother informed of post-discharge support and given phone number to the lactation department, including services for phone call assistance; out-patient appointments; and breastfeeding support group. List of other breastfeeding resources in the community given in the handout. Encouraged mother to call for problems or concerns related to breastfeeding.   Maternal Data Has patient been taught Hand Expression?: Yes (LC reviewed )  Feeding Feeding Type:  (per mom baby recently breast fed ) Length of feed: 20 min (per  mom )  LATCH Score/Interventions                Intervention(s): Breastfeeding basics reviewed     Lactation Tools Discussed/Used Tools: Pump;Flanges Flange Size: 5 (#24 F ok for today, #40F provided for when the milk comes in ) Breast pump type: Manual WIC Program: Yes (per mom , active with WIC ) Pump Review: Setup, frequency, and cleaning;Milk Storage Initiated by:: MAI  Date initiated:: 10/16/15   Consult Status Consult Status: Complete Date:  10/16/15    Myer Haff 10/16/2015, 9:47 AM

## 2015-10-16 NOTE — Discharge Instructions (Signed)

## 2015-10-16 NOTE — Discharge Summary (Signed)
Obstetric Discharge Summary Reason for Admission: rupture of membranes Prenatal Procedures: none Intrapartum Procedures: spontaneous vaginal delivery after IOL w/ cytotec Postpartum Procedures: none Complications-Operative and Postpartum: 1st degree perineal laceration Hemoglobin  Date Value Ref Range Status  10/14/2015 9.2 (L) 12.0 - 15.0 g/dL Final   HCT  Date Value Ref Range Status  10/14/2015 27.6 (L) 36.0 - 46.0 % Final    Physical Exam:  General: alert, cooperative, appears stated age and no distress Lochia: appropriate Uterine Fundus: firm Incision: n/a DVT Evaluation: No evidence of DVT seen on physical exam.  Discharge Diagnoses: Term Pregnancy-delivered  Discharge Information: Date: 10/16/2015 Activity: unrestricted Diet: routine Medications: Ibuprofen and Colace Condition: stable Instructions: refer to practice specific booklet Discharge to: home     Medication List    TAKE these medications   ibuprofen 600 MG tablet Commonly known as:  ADVIL,MOTRIN Take 1 tablet (600 mg total) by mouth every 6 (six) hours as needed.       Newborn Data: Live born female  Birth Weight: 7 lb 8.1 oz (3405 g) APGAR: 8, 9  Home with mother.  Melinda Burch 10/16/2015, 7:50 AM   CNM attestation  Melinda Burch is a 25 y.o. G2P1011 s/p SVD.   Pain is well controlled.  Plan for birth control is oral progesterone-only contraceptive.  Method of Feeding: breast  PE:  BP 115/78 (BP Location: Right Arm)   Pulse (!) 110   Temp 98.5 F (36.9 C) (Oral)   Resp 20   Ht 5\' 4"  (1.626 m)   Wt 68 kg (150 lb)   LMP 12/17/2014   SpO2 98%   Breastfeeding? Unknown   BMI 25.75 kg/m  Fundus firm   Recent Labs  10/14/15 0205 10/14/15 1925  HGB 9.3* 9.2*  HCT 27.8* 27.6*     Plan: discharge today - postpartum care discussed - f/u clinic in 6 weeks for postpartum visit   Serita Grammes, CNM 9:24 AM  10/16/2015

## 2015-10-17 ENCOUNTER — Inpatient Hospital Stay (HOSPITAL_COMMUNITY): Admission: RE | Admit: 2015-10-17 | Payer: Medicaid Other | Source: Ambulatory Visit

## 2015-10-18 ENCOUNTER — Encounter: Payer: Self-pay | Admitting: *Deleted

## 2015-11-19 ENCOUNTER — Ambulatory Visit (INDEPENDENT_AMBULATORY_CARE_PROVIDER_SITE_OTHER): Payer: Medicaid Other | Admitting: Obstetrics and Gynecology

## 2015-11-19 ENCOUNTER — Encounter: Payer: Self-pay | Admitting: Obstetrics and Gynecology

## 2015-11-19 DIAGNOSIS — Z30013 Encounter for initial prescription of injectable contraceptive: Secondary | ICD-10-CM | POA: Diagnosis not present

## 2015-11-19 DIAGNOSIS — Z3202 Encounter for pregnancy test, result negative: Secondary | ICD-10-CM

## 2015-11-19 DIAGNOSIS — Z3480 Encounter for supervision of other normal pregnancy, unspecified trimester: Secondary | ICD-10-CM

## 2015-11-19 LAB — POCT PREGNANCY, URINE: PREG TEST UR: NEGATIVE

## 2015-11-19 MED ORDER — MEDROXYPROGESTERONE ACETATE 150 MG/ML IM SUSP
150.0000 mg | Freq: Once | INTRAMUSCULAR | Status: AC
  Administered 2015-11-19: 150 mg via INTRAMUSCULAR

## 2015-11-19 NOTE — Progress Notes (Signed)
Subjective:     Melinda Burch is a 25 y.o. female who presents for a postpartum visit. She is 5 weeks postpartum following a spontaneous vaginal delivery. I have fully reviewed the prenatal and intrapartum course. The delivery was at [redacted]w[redacted]d gestational weeks. Outcome: spontaneous vaginal delivery. Anesthesia: local. Postpartum course has been unremarkable. Baby's course has been unremarkable. Baby is feeding by breast. Bleeding no bleeding. Bowel function is normal. Bladder function is normal. Patient is sexually active. Contraception method is condoms. Postpartum depression screening: negative.  The following portions of the patient's history were reviewed and updated as appropriate: past family history, past social history and past surgical history.  Review of Systems Pertinent items are noted in HPI.   Objective:    BP 118/73   Pulse 95   Ht 5\' 4"  (1.626 m)   Wt 123 lb 14.4 oz (56.2 kg)   Breastfeeding? Yes   BMI 21.27 kg/m   General:  alert, cooperative and appears stated age   Breasts:   not completed  Lungs: clear to auscultation bilaterally  Heart:  regular rate and rhythm, S1, S2 normal, no murmur, click, rub or gallop  Abdomen: soft, non-tender; bowel sounds normal; no masses,  no organomegaly   Vulva:  not evaluated  Vagina: not evaluated  Cervix:  not evaluated  Corpus: not examined  Adnexa:  not evaluated  Rectal Exam: Not performed.        Assessment:     Normal postpartum exam. Pap smear not done at today's visit.   Plan:    1. Contraception: Depo-Provera injections 2. Normal Post partum exam 3. Follow up in: 1 year or as needed.

## 2015-11-19 NOTE — Patient Instructions (Signed)

## 2015-11-19 NOTE — Progress Notes (Signed)
Opened in error

## 2016-02-04 ENCOUNTER — Ambulatory Visit: Payer: Medicaid Other

## 2016-03-10 ENCOUNTER — Ambulatory Visit: Payer: Medicaid Other

## 2016-03-17 ENCOUNTER — Telehealth: Payer: Self-pay | Admitting: *Deleted

## 2016-03-17 NOTE — Telephone Encounter (Signed)
Called pt and left message stating that I am calling regarding her appt for tomorrow. I asked that she call abck and leave a message stating whether a detailed message can be left on her voice mail.  Pt has appt for Depo Provera injection 2/1 @ 1415. Per chart review, her last injection was 11/19/15 and she was due for next injection 02/04/16-02/18/16. She will need to abstain from sex x2 weeks prior to next injection of Depo Provera and will need negative pregnancy test on Melinda Burch of injection also. Pt will need to re-schedule appt to receive the injection if she has not abstained x2 weeks.

## 2016-03-18 ENCOUNTER — Ambulatory Visit: Payer: Medicaid Other

## 2016-03-18 NOTE — Telephone Encounter (Signed)
Will address with pt when pt arrives today for appt.

## 2016-11-30 ENCOUNTER — Ambulatory Visit (INDEPENDENT_AMBULATORY_CARE_PROVIDER_SITE_OTHER): Payer: Medicaid Other

## 2016-11-30 ENCOUNTER — Inpatient Hospital Stay (HOSPITAL_COMMUNITY)
Admission: AD | Admit: 2016-11-30 | Discharge: 2016-11-30 | Disposition: A | Discharge status: Home / Self Care | Payer: Medicaid Other | Source: Ambulatory Visit | Attending: Family Medicine | Admitting: Family Medicine

## 2016-11-30 ENCOUNTER — Inpatient Hospital Stay (HOSPITAL_COMMUNITY): Payer: Medicaid Other

## 2016-11-30 ENCOUNTER — Encounter (HOSPITAL_COMMUNITY): Payer: Self-pay

## 2016-11-30 VITALS — BP 107/75 | HR 109

## 2016-11-30 DIAGNOSIS — O98811 Other maternal infectious and parasitic diseases complicating pregnancy, first trimester: Secondary | ICD-10-CM | POA: Insufficient documentation

## 2016-11-30 DIAGNOSIS — Z3A01 Less than 8 weeks gestation of pregnancy: Secondary | ICD-10-CM | POA: Diagnosis not present

## 2016-11-30 DIAGNOSIS — B373 Candidiasis of vulva and vagina: Secondary | ICD-10-CM | POA: Insufficient documentation

## 2016-11-30 DIAGNOSIS — Z3201 Encounter for pregnancy test, result positive: Secondary | ICD-10-CM | POA: Diagnosis not present

## 2016-11-30 DIAGNOSIS — B3731 Acute candidiasis of vulva and vagina: Secondary | ICD-10-CM

## 2016-11-30 DIAGNOSIS — O26899 Other specified pregnancy related conditions, unspecified trimester: Secondary | ICD-10-CM

## 2016-11-30 DIAGNOSIS — N912 Amenorrhea, unspecified: Secondary | ICD-10-CM | POA: Diagnosis not present

## 2016-11-30 DIAGNOSIS — O208 Other hemorrhage in early pregnancy: Secondary | ICD-10-CM | POA: Diagnosis not present

## 2016-11-30 DIAGNOSIS — O219 Vomiting of pregnancy, unspecified: Secondary | ICD-10-CM

## 2016-11-30 DIAGNOSIS — O468X1 Other antepartum hemorrhage, first trimester: Secondary | ICD-10-CM

## 2016-11-30 DIAGNOSIS — O26851 Spotting complicating pregnancy, first trimester: Secondary | ICD-10-CM

## 2016-11-30 DIAGNOSIS — O26891 Other specified pregnancy related conditions, first trimester: Secondary | ICD-10-CM | POA: Diagnosis not present

## 2016-11-30 DIAGNOSIS — R109 Unspecified abdominal pain: Secondary | ICD-10-CM

## 2016-11-30 DIAGNOSIS — O418X10 Other specified disorders of amniotic fluid and membranes, first trimester, not applicable or unspecified: Secondary | ICD-10-CM

## 2016-11-30 LAB — CBC WITH DIFFERENTIAL/PLATELET
Basophils Absolute: 0 10*3/uL (ref 0.0–0.1)
Basophils Relative: 0 %
EOS PCT: 1 %
Eosinophils Absolute: 0.1 10*3/uL (ref 0.0–0.7)
HCT: 35.6 % — ABNORMAL LOW (ref 36.0–46.0)
HEMOGLOBIN: 12.5 g/dL (ref 12.0–15.0)
LYMPHS ABS: 1.4 10*3/uL (ref 0.7–4.0)
Lymphocytes Relative: 21 %
MCH: 29.7 pg (ref 26.0–34.0)
MCHC: 35.1 g/dL (ref 30.0–36.0)
MCV: 84.6 fL (ref 78.0–100.0)
MONO ABS: 0.2 10*3/uL (ref 0.1–1.0)
MONOS PCT: 3 %
Neutro Abs: 5 10*3/uL (ref 1.7–7.7)
Neutrophils Relative %: 75 %
PLATELETS: 266 10*3/uL (ref 150–400)
RBC: 4.21 MIL/uL (ref 3.87–5.11)
RDW: 13.5 % (ref 11.5–15.5)
WBC: 6.6 10*3/uL (ref 4.0–10.5)

## 2016-11-30 LAB — WET PREP, GENITAL
Clue Cells Wet Prep HPF POC: NONE SEEN
Sperm: NONE SEEN
Trich, Wet Prep: NONE SEEN

## 2016-11-30 LAB — POCT URINE PREGNANCY: Preg Test, Ur: POSITIVE — AB

## 2016-11-30 LAB — HCG, QUANTITATIVE, PREGNANCY: HCG, BETA CHAIN, QUANT, S: 179483 m[IU]/mL — AB (ref <5)

## 2016-11-30 MED ORDER — PROMETHAZINE HCL 12.5 MG PO TABS: 12.5000 mg | ORAL_TABLET | Freq: Four times a day (QID) | ORAL | 0 refills | Status: DC | PRN

## 2016-11-30 MED ORDER — TERCONAZOLE 0.8 % VA CREA: 1.0000 | TOPICAL_CREAM | Freq: Every day | VAGINAL | 0 refills | Status: DC

## 2016-11-30 NOTE — MAU Provider Note (Signed)
History     CSN: [id number]  Arrival date and time: 11/30/16 7322   First Provider Initiated Contact with Patient 11/30/16 1041      Chief Complaint  Patient presents with  . Vaginal Bleeding  . Abdominal Pain   HPI [name] is a 26 y.o. G3P1011 at [redacted]w[redacted]d who presents to MAU today with complaint of spotting and cramping x 1-2 weeks. The patient states pain is mild and intermittent. She has not had any heavy bleeding, only spotting with wiping. She has had N/V that has worsened over the past few days. She denies diarrhea, she has had some constipation. She denies fever or sick contacts.   OB History    Gravida Para Term Preterm AB Living   3 1 1   1 1    SAB TAB Ectopic Multiple Live Births     1   0 1      History reviewed. No pertinent past medical history.  Past Surgical History:  Procedure Laterality Date  . DILATION AND CURETTAGE OF UTERUS      History reviewed. No pertinent family history.  Social History  Substance Use Topics  . Smoking status: Never Smoker  . Smokeless tobacco: Never Used  . Alcohol use No    Allergies: No Known Allergies  No prescriptions prior to admission.    Review of Systems  Constitutional: Negative for fever.  Gastrointestinal: Positive for nausea and vomiting. Negative for abdominal pain, constipation and diarrhea.  Genitourinary: Positive for pelvic pain and vaginal bleeding. Negative for dysuria, frequency, urgency and vaginal discharge.   Physical Exam   Blood pressure 111/70, pulse 100, temperature 98.3 F (36.8 C), temperature source Oral, resp. rate 16, weight 122 lb 12 oz (55.7 kg), last menstrual period 10/12/2016, SpO2 100 %, currently breastfeeding.  Physical Exam  Nursing note and vitals reviewed. Constitutional: She is oriented to person, place, and time. She appears well-developed and well-nourished. No distress.  HENT:  Head: Normocephalic and atraumatic.  Cardiovascular: Normal rate.   Respiratory:  Effort normal.  GI: Soft. She exhibits no distension and no mass. There is no tenderness. There is no rebound and no guarding.  Genitourinary: Uterus is enlarged (slightly) and tender (mild). Cervix exhibits no motion tenderness, no discharge and no friability. Right adnexum displays fullness. Right adnexum displays no mass and no tenderness. Left adnexum displays no mass and no tenderness. No bleeding in the vagina. Vaginal discharge (moderate, thick, white) found.  Genitourinary Comments: Cervix: closed, thick, firm  Neurological: She is alert and oriented to person, place, and time.  Skin: Skin is warm and dry. No erythema.  Psychiatric: She has a normal mood and affect.    Results for orders placed or performed during the hospital encounter of 11/30/16 (from the past 24 hour(s))  CBC with Differential/Platelet     Status: Abnormal   Collection Time: 11/30/16 10:38 AM  Result Value Ref Range   WBC 6.6 4.0 - 10.5 K/uL   RBC 4.21 3.87 - 5.11 MIL/uL   Hemoglobin 12.5 12.0 - 15.0 g/dL   HCT 35.6 (L) 36.0 - 46.0 %   MCV 84.6 78.0 - 100.0 fL   MCH 29.7 26.0 - 34.0 pg   MCHC 35.1 30.0 - 36.0 g/dL   RDW 13.5 11.5 - 15.5 %   Platelets 266 150 - 400 K/uL   Neutrophils Relative % 75 %   Neutro Abs 5.0 1.7 - 7.7 K/uL   Lymphocytes Relative 21 %   Lymphs Abs  1.4 0.7 - 4.0 K/uL   Monocytes Relative 3 %   Monocytes Absolute 0.2 0.1 - 1.0 K/uL   Eosinophils Relative 1 %   Eosinophils Absolute 0.1 0.0 - 0.7 K/uL   Basophils Relative 0 %   Basophils Absolute 0.0 0.0 - 0.1 K/uL  hCG, quantitative, pregnancy     Status: Abnormal   Collection Time: 11/30/16 10:38 AM  Result Value Ref Range   hCG, Beta Chain, Quant, S 818,563 (H) <5 mIU/mL  Wet prep, genital     Status: Abnormal   Collection Time: 11/30/16 10:50 AM  Result Value Ref Range   Yeast Wet Prep HPF POC PRESENT (A) NONE SEEN   Trich, Wet Prep NONE SEEN NONE SEEN   Clue Cells Wet Prep HPF POC NONE SEEN NONE SEEN   WBC, Wet Prep HPF  POC MODERATE (A) NONE SEEN   Sperm NONE SEEN    US Ob Comp Less 14 Wks  Result Date: 11/30/2016 CLINICAL DATA:  Abdominal pain, spotting EXAM: OBSTETRIC <14 WK [place] AND TRANSVAGINAL OB US TECHNIQUE: Both transabdominal and transvaginal ultrasound examinations were performed for complete evaluation of the gestation as well as the maternal uterus, adnexal regions, and pelvic cul-de-sac. Transvaginal technique was performed to assess early pregnancy. COMPARISON:  None. FINDINGS: Intrauterine gestational sac: Visualized, single Yolk sac:  Visualized Embryo:  Visualized Cardiac Activity: Visualized Heart Rate: 131  bpm MSD:   mm    w     d CRL:  9  mm   6 w   6 d                  [place] EDC: 07/20/2017 Subchorionic hemorrhage:  Small subchorionic hemorrhage Maternal uterus/adnexae: No adnexal mass or free fluid. IMPRESSION: Six week 6 day intrauterine pregnancy. Fetal heart rate 131 beats per minute. Small subchorionic hemorrhage. Electronically Signed   By: Rolm Baptise M.D.   On: 11/30/2016 11:46   US Ob Transvaginal  Result Date: 11/30/2016 CLINICAL DATA:  Abdominal pain, spotting EXAM: OBSTETRIC <14 WK [place] AND TRANSVAGINAL OB US TECHNIQUE: Both transabdominal and transvaginal ultrasound examinations were performed for complete evaluation of the gestation as well as the maternal uterus, adnexal regions, and pelvic cul-de-sac. Transvaginal technique was performed to assess early pregnancy. COMPARISON:  None. FINDINGS: Intrauterine gestational sac: Visualized, single Yolk sac:  Visualized Embryo:  Visualized Cardiac Activity: Visualized Heart Rate: 131  bpm MSD:   mm    w     d CRL:  9  mm   6 w   6 d                  [place] EDC: 07/20/2017 Subchorionic hemorrhage:  Small subchorionic hemorrhage Maternal uterus/adnexae: No adnexal mass or free fluid. IMPRESSION: Six week 6 day intrauterine pregnancy. Fetal heart rate 131 beats per minute. Small subchorionic hemorrhage. Electronically Signed   By: Rolm Baptise M.D.   On:  11/30/2016 11:46    MAU Course  Procedures None  MDM +UPT @ [facility] today  UA, wet prep, GC/chlamydia, CBC, quant hCG, HIV, RPR and [place] today to rule out ectopic pregnancy A+ blood type in Epic from previous visit  Assessment and Plan  A: SIUP at [redacted]w[redacted]d Small subchorionic hemorrhage Yeast vulvovaginitis  Abdominal pain in pregnancy, first trimester   P: Discharge home Rx for Terazol, Phenergan sent to patient's pharmacy  First trimester precautions and pelvic rest discussed Patient advised to follow-up with [facility] as planned to start prenatal care Patient  may return to MAU as needed or if her condition were to change or worsen   Kerry Hough, PA-C 11/30/2016, 11:55 AM

## 2016-11-30 NOTE — Discharge Instructions (Signed)
Vaginal Yeast infection, Adult Vaginal yeast infection is a condition that causes soreness, swelling, and redness (inflammation) of the vagina. It also causes vaginal discharge. This is a common condition. Some women get this infection frequently. What are the causes? This condition is caused by a change in the normal balance of the yeast (candida) and bacteria that live in the vagina. This change causes an overgrowth of yeast, which causes the inflammation. What increases the risk? This condition is more likely to develop in:  Women who take antibiotic medicines.  Women who have diabetes.  Women who take birth control pills.  Women who are pregnant.  Women who douche often.  Women who have a weak defense (immune) system.  Women who have been taking steroid medicines for a long time.  Women who frequently wear tight clothing.  What are the signs or symptoms? Symptoms of this condition include:  White, thick vaginal discharge.  Swelling, itching, redness, and irritation of the vagina. The lips of the vagina (vulva) may be affected as well.  Pain or a burning feeling while urinating.  Pain during sex.  How is this diagnosed? This condition is diagnosed with a medical history and physical exam. This will include a pelvic exam. Your health care provider will examine a sample of your vaginal discharge under a microscope. Your health care provider may send this sample for testing to confirm the diagnosis. How is this treated? This condition is treated with medicine. Medicines may be over-the-counter or prescription. You may be told to use one or more of the following:  Medicine that is taken orally.  Medicine that is applied as a cream.  Medicine that is inserted directly into the vagina (suppository).  Follow these instructions at home:  Take or apply over-the-counter and prescription medicines only as told by your health care provider.  Do not have sex until your health  care provider has approved. Tell your sex partner that you have a yeast infection. That person should go to his or her health care provider if he or she develops symptoms.  Do not wear tight clothes, such as pantyhose or tight pants.  Avoid using tampons until your health care provider approves.  Eat more yogurt. This may help to keep your yeast infection from returning.  Try taking a sitz bath to help with discomfort. This is a warm water bath that is taken while you are sitting down. The water should only come up to your hips and should cover your buttocks. Do this 3-4 times per day or as told by your health care provider.  Do not douche.  Wear breathable, cotton underwear.  If you have diabetes, keep your blood sugar levels under control. Contact a health care provider if:  You have a fever.  Your symptoms go away and then return.  Your symptoms do not get better with treatment.  Your symptoms get worse.  You have new symptoms.  You develop blisters in or around your vagina.  You have blood coming from your vagina and it is not your menstrual period.  You develop pain in your abdomen. This information is not intended to replace advice given to you by your health care provider. Make sure you discuss any questions you have with your health care provider. Document Released: 11/11/2004 Document Revised: 07/16/2015 Document Reviewed: 08/05/2014 Elsevier Interactive Patient Education  2018 [place] Hematoma A subchorionic hematoma is a gathering of blood between the outer wall of the placenta and the inner wall  of the womb (uterus). The placenta is the organ that connects the fetus to the wall of the uterus. The placenta performs the feeding, breathing (oxygen to the fetus), and waste removal (excretory work) of the fetus. Subchorionic hematoma is the most common abnormality found on a result from ultrasonography done during the first trimester or early second  trimester of pregnancy. If there has been little or no vaginal bleeding, early small hematomas usually shrink on their own and do not affect your baby or pregnancy. The blood is gradually absorbed over 1-2 weeks. When bleeding starts later in pregnancy or the hematoma is larger or occurs in an older pregnant woman, the outcome may not be as good. Larger hematomas may get bigger, which increases the chances for miscarriage. Subchorionic hematoma also increases the risk of premature detachment of the placenta from the uterus, preterm (premature) labor, and stillbirth. Follow these instructions at home:  Stay on bed rest if your health care provider recommends this. Although bed rest will not prevent more bleeding or prevent a miscarriage, your health care provider may recommend bed rest until you are advised otherwise.  Avoid heavy lifting (more than 10 lb [4.5 kg]), exercise, sexual intercourse, or douching as directed by your health care provider.  Keep track of the number of pads you use each day and how soaked (saturated) they are. Write down this information.  Do not use tampons.  Keep all follow-up appointments as directed by your health care provider. Your health care provider may ask you to have follow-up blood tests or ultrasound tests or both. Get help right away if:  You have severe cramps in your stomach, back, abdomen, or pelvis.  You have a fever.  You pass large clots or tissue. Save any tissue for your health care provider to look at.  Your bleeding increases or you become lightheaded, feel weak, or have fainting episodes. This information is not intended to replace advice given to you by your health care provider. Make sure you discuss any questions you have with your health care provider. Document Released: 05/19/2006 Document Revised: 07/10/2015 Document Reviewed: 08/31/2012 Elsevier Interactive Patient Education  2017 [employer].

## 2016-11-30 NOTE — MAU Note (Signed)
Urine in lab 

## 2016-11-30 NOTE — Progress Notes (Signed)
Pt here for a pregnancy test. Test today is positive. Last LMP 10/12/16. Pt states that she has ben having some off and on spotting for about a week and a half. Brown in color. Discussed with Dr. Roselie Awkward. Pt advised to go to MAU for evaluation for the spotting.

## 2016-11-30 NOTE — MAU Note (Signed)
Been having some spotting and cramping.  Started about 1-2 wks ago.  Was told to come here to make sure everything was ok.

## 2016-12-01 LAB — RPR: RPR: NONREACTIVE

## 2016-12-01 LAB — GC/CHLAMYDIA PROBE AMP (~~LOC~~) NOT AT ARMC
CHLAMYDIA, DNA PROBE: NEGATIVE
Neisseria Gonorrhea: NEGATIVE

## 2016-12-01 LAB — HIV ANTIBODY (ROUTINE TESTING W REFLEX): HIV SCREEN 4TH GENERATION: NONREACTIVE

## 2017-02-15 NOTE — L&D Delivery Note (Signed)
Delivery Note   Pt progressed w/o augmentation. AROM w/ light mec at 0135. After a 2 ctx 2nd stage, at 1:42 AM a viable female was delivered via Vaginal, Spontaneous (Presentation: LOA;  ).  APGAR:8/9 , ; weight  .  Pending. After 1 minute, the cord was clamped and cut. 40 units of pitocin diluted in 1000cc LR was infused rapidly IV.  The placenta separated spontaneously and delivered via CCT and maternal pushing effort.  It was inspected and appears to be intact with a 3 VC.   Anesthesia:  none Episiotomy: None Lacerations: None Suture Repair:  Est. Blood Loss (mL): 400  Mom to postpartum.  Baby to Couplet care / Skin to Skin.  Joaquim Lai Cresenzo-Dishmon 07/15/2017, 1:55 AM      Please schedule this patient for PP visit in: 4 weeks Low risk pregnancy complicated by:  Delivery mode:  SVD Anticipated Birth Control:  None, doesn't want any PP Procedures needed:   Schedule Integrated BH visit: no Provider: Any provider

## 2017-03-14 ENCOUNTER — Encounter (HOSPITAL_COMMUNITY): Payer: Self-pay | Admitting: *Deleted

## 2017-03-14 ENCOUNTER — Inpatient Hospital Stay (HOSPITAL_COMMUNITY)
Admission: AD | Admit: 2017-03-14 | Discharge: 2017-03-14 | Disposition: A | Discharge status: Home / Self Care | Payer: Medicaid Other | Source: Ambulatory Visit | Attending: Obstetrics and Gynecology | Admitting: Obstetrics and Gynecology

## 2017-03-14 DIAGNOSIS — O219 Vomiting of pregnancy, unspecified: Secondary | ICD-10-CM | POA: Diagnosis not present

## 2017-03-14 DIAGNOSIS — O210 Mild hyperemesis gravidarum: Secondary | ICD-10-CM | POA: Diagnosis present

## 2017-03-14 DIAGNOSIS — Z3A21 21 weeks gestation of pregnancy: Secondary | ICD-10-CM | POA: Diagnosis not present

## 2017-03-14 DIAGNOSIS — O0992 Supervision of high risk pregnancy, unspecified, second trimester: Secondary | ICD-10-CM

## 2017-03-14 DIAGNOSIS — O26892 Other specified pregnancy related conditions, second trimester: Secondary | ICD-10-CM

## 2017-03-14 DIAGNOSIS — O0932 Supervision of pregnancy with insufficient antenatal care, second trimester: Secondary | ICD-10-CM | POA: Diagnosis not present

## 2017-03-14 DIAGNOSIS — R12 Heartburn: Secondary | ICD-10-CM

## 2017-03-14 LAB — URINALYSIS, ROUTINE W REFLEX MICROSCOPIC
Bilirubin Urine: NEGATIVE
Glucose, UA: NEGATIVE mg/dL
Hgb urine dipstick: NEGATIVE
KETONES UR: NEGATIVE mg/dL
Nitrite: NEGATIVE
PH: 6 (ref 5.0–8.0)
PROTEIN: NEGATIVE mg/dL
Specific Gravity, Urine: 1.015 (ref 1.005–1.030)

## 2017-03-14 MED ORDER — VITAFOL GUMMIES 3.33-0.333-34.8 MG PO CHEW: 3.0000 | CHEWABLE_TABLET | Freq: Every day | ORAL | 11 refills | Status: DC

## 2017-03-14 MED ORDER — DOXYLAMINE-PYRIDOXINE 10-10 MG PO TBEC: DELAYED_RELEASE_TABLET | ORAL | 1 refills | Status: DC

## 2017-03-14 MED ORDER — RANITIDINE HCL 150 MG PO TABS: 150.0000 mg | ORAL_TABLET | Freq: Two times a day (BID) | ORAL | 1 refills | Status: DC

## 2017-03-14 NOTE — Discharge Instructions (Signed)
Expect ultrasound to call you to schedule an appointment. Make an appointment for prenatal care as soon as possible. Get your medications at the pharmacy and take as directed. Return if you have fever, severe abdominal pain, vaginal bleeding or leaking of fluid. Drink at least 8 8-oz glasses of water every day. Take Tylenol 325 mg 2 tablets by mouth every 4 hours if needed for pain. Get the over the counter tums and take as needed for heartburn.

## 2017-03-14 NOTE — MAU Provider Note (Signed)
History     CSN: [id number]  Arrival date and time: 03/14/17 1056   First Provider Initiated Contact with Patient 03/14/17 1129      Chief Complaint  Patient presents with  . Emesis  . Hematemesis   HPI Melinda Burch 27 y.o. [redacted]w[redacted]d  Comes to MAU with vomiting and having brown vomitus with blood in the vomitus.  Has not yet started prenatal care.  Eating ice chips in MAU.  Reports having vomiting in early pregnancy that resolved.  But the vomiting started again about one week ago.  Reports having heartburn but has not tried any medication for heartburn.  States she usually gets her prenatal care late and is not worried - she did with her last baby.  Has a medicaid card and plans to call the clinic (has the phone number) to set up an appointment.  OB History    Gravida Para Term Preterm AB Living   3 1 1   1 1    SAB TAB Ectopic Multiple Live Births     1   0 1      History reviewed. No pertinent past medical history.  Past Surgical History:  Procedure Laterality Date  . DILATION AND CURETTAGE OF UTERUS      History reviewed. No pertinent family history.  Social History   Tobacco Use  . Smoking status: Never Smoker  . Smokeless tobacco: Never Used  Substance Use Topics  . Alcohol use: No  . Drug use: No    Allergies: No Known Allergies  Medications Prior to Admission  Medication Sig Dispense Refill Last Dose  . promethazine (PHENERGAN) 12.5 MG tablet Take 1 tablet (12.5 mg total) by mouth every 6 (six) hours as needed for nausea or vomiting. (Patient not taking: Reported on 03/14/2017) 30 tablet 0 Not Taking at Unknown time  . terconazole (TERAZOL 3) 0.8 % vaginal cream Place 1 applicator vaginally at bedtime. (Patient not taking: Reported on 03/14/2017) 20 g 0 Not Taking at Unknown time    Review of Systems  Constitutional: Negative for fever.  Gastrointestinal: Positive for nausea and vomiting. Negative for abdominal pain and diarrhea.  Genitourinary: Negative for  vaginal bleeding and vaginal discharge.   Physical Exam   Blood pressure 103/64, pulse (!) 110, temperature 97.8 F (36.6 C), temperature source Oral, resp. rate 18, height 5\' 4"  (1.626 m), weight 127 lb (57.6 kg), last menstrual period 10/12/2016, SpO2 100 %, currently breastfeeding.  Physical Exam  Nursing note and vitals reviewed. Constitutional: She is oriented to person, place, and time. She appears well-developed and well-nourished. No distress.  HENT:  Head: Normocephalic.  Eyes: EOM are normal.  Neck: Neck supple.  Respiratory: Effort normal.  GI: Soft. There is no tenderness. There is no rebound and no guarding.  Musculoskeletal: Normal range of motion.  Neurological: She is alert and oriented to person, place, and time.  Skin: Skin is warm and dry.  Psychiatric: She has a normal mood and affect.    MAU Course  Procedures Results for orders placed or performed during the hospital encounter of 03/14/17 (from the past 24 hour(s))  Urinalysis, Routine w reflex microscopic     Status: Abnormal   Collection Time: 03/14/17 11:03 AM  Result Value Ref Range   Color, Urine YELLOW YELLOW   APPearance HAZY (A) CLEAR   Specific Gravity, Urine 1.015 1.005 - 1.030   pH 6.0 5.0 - 8.0   Glucose, UA NEGATIVE NEGATIVE mg/dL   Hgb urine dipstick NEGATIVE  NEGATIVE   Bilirubin Urine NEGATIVE NEGATIVE   Ketones, ur NEGATIVE NEGATIVE mg/dL   Protein, ur NEGATIVE NEGATIVE mg/dL   Nitrite NEGATIVE NEGATIVE   Leukocytes, UA MODERATE (A) NEGATIVE   RBC / HPF 6-30 0 - 5 RBC/hpf   WBC, UA 6-30 0 - 5 WBC/hpf   Bacteria, UA MANY (A) NONE SEEN   Squamous Epithelial / LPF 6-30 (A) NONE SEEN   Mucus PRESENT    Budding Yeast PRESENT     MDM Declines Zofran or Phenergan today.  Declines offer of Zantac here in MAU.  Requesting an ultrasound.  Discussed how to take diclegis and zantac to help with nausea and vomiting.    Ate chicken yesterday and did not vomit.  Today vomited rice and saw blood  in the vomitus.  Reviewed with client the mechanics of how vomiting can irritate the esophagus and cause some bleeding when vomiting.  At this point, this bleeding is not a severe problem.  Reviewed appropriate foods to be eating to avoid aggravating the vomiting.  Assessment and Plan  No prenatal care Nausea and vomiting in pregnancy - although no ketones in her urine today  Plan Prescribed diclegis and zofran - after client left, request for preauthorization for diclegis came - unable to process that in MAU.  If vomiting continues to be a problem, can get preauthorization at clinic visit. Sent message to ultrasound to make an appointment for client to have detailed anatomy as she has not had prenatal care with this pregnancy. Left message on chart sticky note - noted after client left that there was yeast in her urine.  She had no complaint of that today.  Will offer treatment when she comes for NOB appointment. Make an appointment for prenatal care as soon as possible. Get your medications at the pharmacy and take as directed. Return if you have fever, severe abdominal pain, vaginal bleeding or leaking of fluid. Drink at least 8 8-oz glasses of water every day. Take Tylenol 325 mg 2 tablets by mouth every 4 hours if needed for pain. Get the over the counter tums and take as needed for heartburn.  Melinda Burch Melinda Burch 03/14/2017, 11:44 AM

## 2017-03-14 NOTE — MAU Note (Signed)
Pt reports she had a lot of vomiting in early pregnancy, had gotten better but started again about 1 week ago. Denies diarrhea, fever, or abd pain. States for the last few days she has noticed some reddish/brown in her vomit.

## 2017-04-05 ENCOUNTER — Ambulatory Visit (INDEPENDENT_AMBULATORY_CARE_PROVIDER_SITE_OTHER): Payer: Self-pay | Admitting: Obstetrics and Gynecology

## 2017-04-05 ENCOUNTER — Other Ambulatory Visit (HOSPITAL_COMMUNITY)
Admission: RE | Admit: 2017-04-05 | Discharge: 2017-04-05 | Disposition: A | Discharge status: Home / Self Care | Payer: Medicaid Other | Source: Ambulatory Visit | Attending: Obstetrics and Gynecology | Admitting: Obstetrics and Gynecology

## 2017-04-05 ENCOUNTER — Encounter: Payer: Self-pay | Admitting: Obstetrics and Gynecology

## 2017-04-05 VITALS — BP 104/70 | HR 101 | Wt 132.0 lb

## 2017-04-05 DIAGNOSIS — Z3480 Encounter for supervision of other normal pregnancy, unspecified trimester: Secondary | ICD-10-CM | POA: Insufficient documentation

## 2017-04-05 DIAGNOSIS — R51 Headache: Secondary | ICD-10-CM | POA: Insufficient documentation

## 2017-04-05 DIAGNOSIS — O26892 Other specified pregnancy related conditions, second trimester: Secondary | ICD-10-CM | POA: Insufficient documentation

## 2017-04-05 DIAGNOSIS — R6889 Other general symptoms and signs: Secondary | ICD-10-CM

## 2017-04-05 DIAGNOSIS — Z3A25 25 weeks gestation of pregnancy: Secondary | ICD-10-CM | POA: Insufficient documentation

## 2017-04-05 DIAGNOSIS — O99012 Anemia complicating pregnancy, second trimester: Secondary | ICD-10-CM | POA: Insufficient documentation

## 2017-04-05 DIAGNOSIS — D573 Sickle-cell trait: Secondary | ICD-10-CM | POA: Insufficient documentation

## 2017-04-05 DIAGNOSIS — R6883 Chills (without fever): Secondary | ICD-10-CM | POA: Insufficient documentation

## 2017-04-05 DIAGNOSIS — Z3482 Encounter for supervision of other normal pregnancy, second trimester: Secondary | ICD-10-CM | POA: Insufficient documentation

## 2017-04-05 HISTORY — DX: Encounter for supervision of other normal pregnancy, unspecified trimester: Z34.80

## 2017-04-05 HISTORY — DX: Sickle-cell trait: D57.3

## 2017-04-05 LAB — POCT URINALYSIS DIP (DEVICE)
Bilirubin Urine: NEGATIVE
Glucose, UA: NEGATIVE mg/dL
KETONES UR: NEGATIVE mg/dL
Nitrite: NEGATIVE
PH: 6.5 (ref 5.0–8.0)
PROTEIN: NEGATIVE mg/dL
Specific Gravity, Urine: 1.02 (ref 1.005–1.030)
Urobilinogen, UA: 1 mg/dL (ref 0.0–1.0)

## 2017-04-05 NOTE — Assessment & Plan Note (Signed)
  Clinic  [facility] Prenatal Labs  Dating  LMP Blood type:     Genetic Screen 1 Screen:    AFP:     Quad:     NIPS: Antibody:   Anatomic [place]  Scheduled  Rubella:    GTT Early:               Third trimester:  RPR: Non Reactive (10/16 1038)   Flu vaccine  Declined (2/19) HBsAg:     TDaP vaccine                                               Rhogam: N/A HIV: Non Reactive (10/16 1038)   Baby Food   Breast                                          GBS: (For PCN allergy, check sensitivities)  Contraception  Unsure Pap:  Circumcision    Pediatrician   CF:  Support Person  SMA  Prenatal Classes  Hgb electrophoresis:

## 2017-04-05 NOTE — Addendum Note (Signed)
Addended by: Bethanne Ginger on: 04/05/2017 05:01 PM   Modules accepted: Orders

## 2017-04-05 NOTE — Patient Instructions (Signed)
Second Trimester of Pregnancy The second trimester is from week 13 through week 28, month 4 through 6. This is often the time in pregnancy that you feel your best. Often times, morning sickness has lessened or quit. You may have more energy, and you may get hungry more often. Your unborn baby (fetus) is growing rapidly. At the end of the sixth month, he or she is about 9 inches long and weighs about 1 pounds. You will likely feel the baby move (quickening) between 18 and 20 weeks of pregnancy. Follow these instructions at home:  Avoid all smoking, herbs, and alcohol. Avoid drugs not approved by your doctor.  Do not use any tobacco products, including cigarettes, chewing tobacco, and electronic cigarettes. If you need help quitting, ask your doctor. You may get counseling or other support to help you quit.  Only take medicine as told by your doctor. Some medicines are safe and some are not during pregnancy.  Exercise only as told by your doctor. Stop exercising if you start having cramps.  Eat regular, healthy meals.  Wear a good support bra if your breasts are tender.  Do not use hot tubs, steam rooms, or saunas.  Wear your seat belt when driving.  Avoid raw meat, uncooked cheese, and liter boxes and soil used by cats.  Take your prenatal vitamins.  Take 1500-2000 milligrams of calcium daily starting at the 20th week of pregnancy until you deliver your baby.  Try taking medicine that helps you poop (stool softener) as needed, and if your doctor approves. Eat more fiber by eating fresh fruit, vegetables, and whole grains. Drink enough fluids to keep your pee (urine) clear or pale yellow.  Take warm water baths (sitz baths) to soothe pain or discomfort caused by hemorrhoids. Use hemorrhoid cream if your doctor approves.  If you have puffy, bulging veins (varicose veins), wear support hose. Raise (elevate) your feet for 15 minutes, 3-4 times a day. Limit salt in your diet.  Avoid heavy  lifting, wear low heals, and sit up straight.  Rest with your legs raised if you have leg cramps or low back pain.  Visit your dentist if you have not gone during your pregnancy. Use a soft toothbrush to brush your teeth. Be gentle when you floss.  You can have sex (intercourse) unless your doctor tells you not to.  Go to your doctor visits. Get help if:  You feel dizzy.  You have mild cramps or pressure in your lower belly (abdomen).  You have a nagging pain in your belly area.  You continue to feel sick to your stomach (nauseous), throw up (vomit), or have watery poop (diarrhea).  You have bad smelling fluid coming from your vagina.  You have pain with peeing (urination). Get help right away if:  You have a fever.  You are leaking fluid from your vagina.  You have spotting or bleeding from your vagina.  You have severe belly cramping or pain.  You lose or gain weight rapidly.  You have trouble catching your breath and have chest pain.  You notice sudden or extreme puffiness (swelling) of your face, hands, ankles, feet, or legs.  You have not felt the baby move in over an hour.  You have severe headaches that do not go away with medicine.  You have vision changes. This information is not intended to replace advice given to you by your health care provider. Make sure you discuss any questions you have with your health care   provider. Document Released: 04/28/2009 Document Revised: 07/10/2015 Document Reviewed: 04/04/2012 Elsevier Interactive Patient Education  2017 Elsevier Inc.  

## 2017-04-05 NOTE — Progress Notes (Signed)
Pt stated having cold, headache, sweating for about 2 days

## 2017-04-05 NOTE — Progress Notes (Signed)
New OB Note  04/05/2017   CC:  Chief Complaint  Patient presents with  . Initial Prenatal Visit    Transfer of Care Patient: no  History of Present Illness: Melinda Burch is a 27 y.o. G3P1011 at [redacted]w[redacted]d by LMP being seen today for her first obstetrical visit. Her obstetrical history is significant for nothing. Late to prenatal care due to transportation per patient. This was an unplanned pregnancy. Patient does intend to breast feed. Patient unsure for contraception after completion of pregnancy. Pregnancy history fully reviewed.  Her periods were: regular periods She was using no method when she conceived.  She has Negative signs or symptoms of nausea/vomiting of pregnancy. She has Negative signs or symptoms of miscarriage or preterm labor  Patient reports cold symtpoms. Symptoms have been present for the last two days and consist of URI symptoms. Headache, and sweats/chills. Feels feverish at times. Not taken anything. Has had a sick contact with similar symptoms.   Any prior children are healthy, doing well, without any problems or issues: yes Complications in prior pregnancies: no  Complications in prior deliveries: no    ROS: A 12-point review of systems was performed and negative, except as stated in the above HPI.   OBGYN History: OB History  Gravida Para Term Preterm AB Living  3 1 1   1 1   SAB TAB Ectopic Multiple Live Births    1   0 1    # Outcome Date GA Lbr Len/2nd Weight Sex Delivery Anes PTL Lv  3 Current           2 Term 10/14/15 [redacted]w[redacted]d 12:30 / 00:11 7 lb 8.1 oz (3.405 kg) M Vag-Spont None  LIV  1 TAB 03/2014              GYN Hx: Normal. Last pap 06/2015 was normal.   Past Medical History: No past medical history on file.  Past Surgical History: Past Surgical History:  Procedure Laterality Date  . DILATION AND CURETTAGE OF UTERUS      Family History:  No family history on file.  Social History:  Social History   Socioeconomic History  .  Marital status: Married    Spouse name: Not on file  . Number of children: Not on file  . Years of education: Not on file  . Highest education level: Not on file  Social Needs  . Financial resource strain: Not on file  . Food insecurity - worry: Not on file  . Food insecurity - inability: Not on file  . Transportation needs - medical: Not on file  . Transportation needs - non-medical: Not on file  Occupational History  . Not on file  Tobacco Use  . Smoking status: Never Smoker  . Smokeless tobacco: Never Used  Substance and Sexual Activity  . Alcohol use: No  . Drug use: No  . Sexual activity: Yes  Other Topics Concern  . Not on file  Social History Narrative  . Not on file    Allergy: No Known Allergies  Current Outpatient Medications:  Current Outpatient Medications:  .  Prenatal Vit-Fe Phos-FA-Omega (VITAFOL GUMMIES) 3.33-0.333-34.8 MG CHEW, Chew 3 each by mouth daily. (Patient not taking: Reported on 04/05/2017), Disp: 90 tablet, Rfl: 11  Physical Exam:   BP 104/70   Pulse (!) 101   Wt 132 lb (59.9 kg)   LMP 10/12/2016   BMI 22.66 kg/m  Body mass index is 22.66 kg/m. Contractions: Not present  Vag. Bleeding: None. Fundal height: 26 cm FHTs: 158 bpm   General appearance: Well nourished, well developed female in no acute distress.  Neck:  Supple, normal appearance, and no thyromegaly  Cardiovascular: regular rate and rhythm Respiratory:  Clear to auscultation bilateral. Normal respiratory effort Abdomen: positive bowel sounds and no masses, hernias; diffusely non tender to palpation, non distended, soft, gravid appropriate for gestation  Breasts: breasts appear normal, no suspicious masses, no skin or nipple changes or axillary nodes. Neuro/Psych:  Normal mood and affect.  Skin:  Warm and dry.  Lymphatic:  No inguinal lymphadenopathy.   Pelvic exam: is not limited by body habitus EGBUS: within normal limits, Vagina: within normal limits and with no blood in  the vault, Cervix: normal appearing cervix without discharge or lesions, closed/long/high, Uterus:  enlarged and Adnexa:  normal adnexa and no mass, fullness, tenderness   Assessment/Plan: G3P1011 [redacted]w[redacted]d  1. Supervision of other normal pregnancy, antepartum - Obstetric Panel, Including HIV - Genetic Screening - Hemoglobinopathy Evaluation - SMN1 COPY NUMBER ANALYSIS ([facility] Carrier Screen) - Culture, OB Urine - had yeast on last UA but patient asymptomatic.  - Cystic fibrosis gene test - GC/Chlamydia probe amp ([place])not at [facility] Anatomy US scheduled No need for pap smear at this time as prior 2 years ago normal Needs 28wk labs at next visit   2. Sickle cell trait ([place]) Discussed need for FOB to have testing. Patient will discuss with partner. Follow-up at next visit.   3. Flu-like symptoms Rapid flu sent. Will treat if positive.   Initial labs drawn. Continue prenatal vitamins. Genetic Screening discussed, orders placed. Ultrasound discussed; fetal anatomic survey: ordered. Problem list reviewed and updated. The nature of [place] with multiple MDs and other Advanced Practice Providers was explained to patient; also emphasized that residents, students are part of our team. Routine obstetric precautions reviewed. Return in about 3 weeks (around 04/26/2017) for ob visit.    Luiz Blare, DO OB Fellow Center for [facility], [facility]

## 2017-04-06 ENCOUNTER — Other Ambulatory Visit: Payer: Self-pay | Admitting: Obstetrics and Gynecology

## 2017-04-06 DIAGNOSIS — Z3480 Encounter for supervision of other normal pregnancy, unspecified trimester: Secondary | ICD-10-CM

## 2017-04-06 LAB — INFLUENZA A AND B
Influenza A Ag, EIA: POSITIVE — AB
Influenza B Ag, EIA: NEGATIVE

## 2017-04-06 LAB — GC/CHLAMYDIA PROBE AMP (~~LOC~~) NOT AT ARMC
CHLAMYDIA, DNA PROBE: NEGATIVE
NEISSERIA GONORRHEA: NEGATIVE

## 2017-04-06 MED ORDER — OSELTAMIVIR PHOSPHATE 75 MG PO CAPS: 75.0000 mg | ORAL_CAPSULE | Freq: Two times a day (BID) | ORAL | 0 refills | Status: AC

## 2017-04-07 LAB — CERVICOVAGINAL ANCILLARY ONLY
Chlamydia: NEGATIVE
Neisseria Gonorrhea: NEGATIVE

## 2017-04-09 LAB — URINE CULTURE, OB REFLEX

## 2017-04-09 LAB — CULTURE, OB URINE

## 2017-04-11 ENCOUNTER — Ambulatory Visit (HOSPITAL_COMMUNITY)
Admission: RE | Admit: 2017-04-11 | Discharge: 2017-04-11 | Disposition: A | Discharge status: Home / Self Care | Payer: Medicaid Other | Source: Ambulatory Visit | Attending: Nurse Practitioner | Admitting: Nurse Practitioner

## 2017-04-11 DIAGNOSIS — Z3A25 25 weeks gestation of pregnancy: Secondary | ICD-10-CM | POA: Insufficient documentation

## 2017-04-11 DIAGNOSIS — Z862 Personal history of diseases of the blood and blood-forming organs and certain disorders involving the immune mechanism: Secondary | ICD-10-CM | POA: Insufficient documentation

## 2017-04-11 DIAGNOSIS — O0932 Supervision of pregnancy with insufficient antenatal care, second trimester: Secondary | ICD-10-CM | POA: Insufficient documentation

## 2017-04-11 DIAGNOSIS — O0992 Supervision of high risk pregnancy, unspecified, second trimester: Secondary | ICD-10-CM | POA: Insufficient documentation

## 2017-04-11 DIAGNOSIS — Z363 Encounter for antenatal screening for malformations: Secondary | ICD-10-CM | POA: Insufficient documentation

## 2017-04-13 LAB — OBSTETRIC PANEL, INCLUDING HIV
ANTIBODY SCREEN: NEGATIVE
BASOS ABS: 0 10*3/uL (ref 0.0–0.2)
BASOS: 0 %
EOS (ABSOLUTE): 0.2 10*3/uL (ref 0.0–0.4)
Eos: 3 %
HIV SCREEN 4TH GENERATION: NONREACTIVE
Hematocrit: 31 % — ABNORMAL LOW (ref 34.0–46.6)
Hemoglobin: 10.1 g/dL — ABNORMAL LOW (ref 11.1–15.9)
Hepatitis B Surface Ag: NEGATIVE
Immature Grans (Abs): 0 10*3/uL (ref 0.0–0.1)
Immature Granulocytes: 0 %
LYMPHS: 15 %
Lymphocytes Absolute: 1.2 10*3/uL (ref 0.7–3.1)
MCH: 29.2 pg (ref 26.6–33.0)
MCHC: 32.6 g/dL (ref 31.5–35.7)
MCV: 90 fL (ref 79–97)
MONOCYTES: 7 %
MONOS ABS: 0.5 10*3/uL (ref 0.1–0.9)
NEUTROS ABS: 5.6 10*3/uL (ref 1.4–7.0)
Neutrophils: 75 %
PLATELETS: 250 10*3/uL (ref 150–379)
RBC: 3.46 x10E6/uL — ABNORMAL LOW (ref 3.77–5.28)
RDW: 13.1 % (ref 12.3–15.4)
RPR Ser Ql: NONREACTIVE
RUBELLA: 0.97 {index} — AB (ref >0.99)
Rh Factor: POSITIVE
WBC: 7.5 10*3/uL (ref 3.4–10.8)

## 2017-04-13 LAB — SMN1 COPY NUMBER ANALYSIS (SMA CARRIER SCREENING)

## 2017-04-13 LAB — CYSTIC FIBROSIS GENE TEST

## 2017-04-13 LAB — HEMOGLOBINOPATHY EVALUATION
FERRITIN: 13 ng/mL — AB (ref 15–150)
HGB A2 QUANT: 3.9 % — AB (ref 1.8–3.2)
HGB C: 0 %
HGB F QUANT: 0 % (ref 0.0–2.0)
Hgb A: 56.1 % — ABNORMAL LOW (ref 96.4–98.8)
Hgb S: 40 % — ABNORMAL HIGH
Hgb Solubility: POSITIVE — AB
Hgb Variant: 0 %

## 2017-04-18 ENCOUNTER — Other Ambulatory Visit: Payer: Self-pay | Admitting: Obstetrics and Gynecology

## 2017-04-18 MED ORDER — CEPHALEXIN 500 MG PO CAPS: 500.0000 mg | ORAL_CAPSULE | Freq: Four times a day (QID) | ORAL | 0 refills | Status: AC

## 2017-04-28 ENCOUNTER — Encounter: Payer: Self-pay | Admitting: Student

## 2017-04-28 DIAGNOSIS — Z283 Underimmunization status: Secondary | ICD-10-CM | POA: Insufficient documentation

## 2017-04-28 DIAGNOSIS — O9989 Other specified diseases and conditions complicating pregnancy, childbirth and the puerperium: Secondary | ICD-10-CM

## 2017-04-28 DIAGNOSIS — Z2839 Other underimmunization status: Secondary | ICD-10-CM | POA: Insufficient documentation

## 2017-04-28 DIAGNOSIS — O09899 Supervision of other high risk pregnancies, unspecified trimester: Secondary | ICD-10-CM | POA: Insufficient documentation

## 2017-04-28 HISTORY — DX: Other underimmunization status: Z28.39

## 2017-04-29 ENCOUNTER — Other Ambulatory Visit: Payer: Medicaid Other

## 2017-04-29 ENCOUNTER — Encounter: Payer: Medicaid Other | Admitting: Student

## 2017-05-27 ENCOUNTER — Ambulatory Visit (INDEPENDENT_AMBULATORY_CARE_PROVIDER_SITE_OTHER): Payer: Self-pay | Admitting: Nurse Practitioner

## 2017-05-27 VITALS — BP 107/69 | HR 120 | Wt 134.2 lb

## 2017-05-27 DIAGNOSIS — D573 Sickle-cell trait: Secondary | ICD-10-CM

## 2017-05-27 DIAGNOSIS — Z3483 Encounter for supervision of other normal pregnancy, third trimester: Secondary | ICD-10-CM

## 2017-05-27 DIAGNOSIS — Z3480 Encounter for supervision of other normal pregnancy, unspecified trimester: Secondary | ICD-10-CM

## 2017-05-27 NOTE — Progress Notes (Signed)
    Subjective:  Melinda Burch is a 26 y.o. G3P1011 at [redacted]w[redacted]d being seen today for ongoing prenatal care.  She is currently monitored for the following issues for this low-risk pregnancy and has ECZEMA; Supervision of other normal pregnancy, antepartum; Sickle cell trait ([place]); and Rubella non-immune status, antepartum on their problem list.  Patient reports no complaints.  Contractions: Not present. Vag. Bleeding: None.  Movement: Present. Denies leaking of fluid.   The following portions of the patient's history were reviewed and updated as appropriate: allergies, current medications, past family history, past medical history, past social history, past surgical history and problem list. Problem list updated.  Objective:   Vitals:   05/27/17 1431  BP: 107/69  Pulse: (!) 120  Weight: 134 lb 3.2 oz (60.9 kg)    Fetal Status: Fetal Heart Rate (bpm): 146 Fundal Height: 32 cm Movement: Present     General:  Alert, oriented and cooperative. Patient is in no acute distress.  Skin: Skin is warm and dry. No rash noted.   Cardiovascular: Normal heart rate noted  Respiratory: Normal respiratory effort, no problems with respiration noted  Abdomen: Soft, gravid, appropriate for gestational age. Pain/Pressure: Absent     Pelvic:  Cervical exam deferred        Extremities: Normal range of motion.  Edema: None  Mental Status: Normal mood and affect. Normal behavior. Normal judgment and thought content.   Urinalysis:    NA  Assessment and Plan:  Pregnancy: G3P1011 at [redacted]w[redacted]d  1. Sickle cell trait ([place]) Advised to have FOB   2. Supervision of other normal pregnancy, antepartum Client lying on her side in the bed for almost the whole visit with her eyes closed. Declines TDAP after counseling by RN and provider  - aware that TDap does give baby protection from whooping cough. States contraception will be abstinence.  Not interested in any other choices - reviewed IUD with her. Has missed several  visits.  Assures provider she is able to come to her visits every 2 weeks.  - CBC - HIV antibody - RPR - Glucose tolerance, 1 hour  Preterm labor symptoms and general obstetric precautions including but not limited to vaginal bleeding, contractions, leaking of fluid and fetal movement were reviewed in detail with the patient. Please refer to After Visit Summary for other counseling recommendations.  Return in about 2 weeks (around 06/10/2017).  Earlie Server, RN, MSN, NP-BC Nurse Practitioner, [facility] for [employer], [place] [facility] 05/27/2017 3:13 PM

## 2017-05-27 NOTE — Progress Notes (Signed)
Pt declined f/u and Tdap vaccine. Gave patient OB packet.

## 2017-05-27 NOTE — Patient Instructions (Addendum)
Have father of the baby tested for Sickle Cell at [facility].  Third Trimester of Pregnancy The third trimester is from week 29 through week 42, months 7 through 9. This trimester is when your unborn baby (fetus) is growing very fast. At the end of the ninth month, the unborn baby is about 20 inches in length. It weighs about 6-10 pounds. Follow these instructions at home:  Avoid all smoking, herbs, and alcohol. Avoid drugs not approved by your doctor.  Do not use any tobacco products, including cigarettes, chewing tobacco, and electronic cigarettes. If you need help quitting, ask your doctor. You may get counseling or other support to help you quit.  Only take medicine as told by your doctor. Some medicines are safe and some are not during pregnancy.  Exercise only as told by your doctor. Stop exercising if you start having cramps.  Eat regular, healthy meals.  Wear a good support bra if your breasts are tender.  Do not use hot tubs, steam rooms, or saunas.  Wear your seat belt when driving.  Avoid raw meat, uncooked cheese, and liter boxes and soil used by cats.  Take your prenatal vitamins.  Take 1500-2000 milligrams of calcium daily starting at the 20th week of pregnancy until you deliver your baby.  Try taking medicine that helps you poop (stool softener) as needed, and if your doctor approves. Eat more fiber by eating fresh fruit, vegetables, and whole grains. Drink enough fluids to keep your pee (urine) clear or pale yellow.  Take warm water baths (sitz baths) to soothe pain or discomfort caused by hemorrhoids. Use hemorrhoid cream if your doctor approves.  If you have puffy, bulging veins (varicose veins), wear support hose. Raise (elevate) your feet for 15 minutes, 3-4 times a day. Limit salt in your diet.  Avoid heavy lifting, wear low heels, and sit up straight.  Rest with your legs raised if you have leg cramps or low back pain.  Visit your dentist  if you have not gone during your pregnancy. Use a soft toothbrush to brush your teeth. Be gentle when you floss.  You can have sex (intercourse) unless your doctor tells you not to.  Do not travel far distances unless you must. Only do so with your doctor's approval.  Take prenatal classes.  Practice driving to the hospital.  Pack your hospital bag.  Prepare the baby's room.  Go to your doctor visits. Get help if:  You are not sure if you are in labor or if your water has broken.  You are dizzy.  You have mild cramps or pressure in your lower belly (abdominal).  You have a nagging pain in your belly area.  You continue to feel sick to your stomach (nauseous), throw up (vomit), or have watery poop (diarrhea).  You have bad smelling fluid coming from your vagina.  You have pain with peeing (urination). Get help right away if:  You have a fever.  You are leaking fluid from your vagina.  You are spotting or bleeding from your vagina.  You have severe belly cramping or pain.  You lose or gain weight rapidly.  You have trouble catching your breath and have chest pain.  You notice sudden or extreme puffiness (swelling) of your face, hands, ankles, feet, or legs.  You have not felt the baby move in over an hour.  You have severe headaches that do not go away with medicine.  You have vision changes. This information  is not intended to replace advice given to you by your health care provider. Make sure you discuss any questions you have with your health care provider. Document Released: 04/28/2009 Document Revised: 07/10/2015 Document Reviewed: 04/04/2012 Elsevier Interactive Patient Education  2017 Elsevier Inc.  [employer] of the uterus can occur throughout pregnancy, but they are not always a sign that you are in labor. You may have practice contractions called Braxton Hicks contractions. These false labor contractions are sometimes  confused with true labor. What are [place] contractions? Braxton Hicks contractions are tightening movements that occur in the muscles of the uterus before labor. Unlike true labor contractions, these contractions do not result in opening (dilation) and thinning of the cervix. Toward the end of pregnancy (32-34 weeks), Braxton Hicks contractions can happen more often and may become stronger. These contractions are sometimes difficult to tell apart from true labor because they can be very uncomfortable. You should not feel embarrassed if you go to the hospital with false labor. Sometimes, the only way to tell if you are in true labor is for your health care provider to look for changes in the cervix. The health care provider will do a physical exam and may monitor your contractions. If you are not in true labor, the exam should show that your cervix is not dilating and your water has not broken. If there are other health problems associated with your pregnancy, it is completely safe for you to be sent home with false labor. You may continue to have Braxton Hicks contractions until you go into true labor. How to tell the difference between true labor and false labor True labor  Contractions last 30-70 seconds.  Contractions become very regular.  Discomfort is usually felt in the top of the uterus, and it spreads to the lower abdomen and low back.  Contractions do not go away with walking.  Contractions usually become more intense and increase in frequency.  The cervix dilates and gets thinner. False labor  Contractions are usually shorter and not as strong as true labor contractions.  Contractions are usually irregular.  Contractions are often felt in the front of the lower abdomen and in the groin.  Contractions may go away when you walk around or change positions while lying down.  Contractions get weaker and are shorter-lasting as time goes on.  The cervix usually does not  dilate or become thin. Follow these instructions at home:  Take over-the-counter and prescription medicines only as told by your health care provider.  Keep up with your usual exercises and follow other instructions from your health care provider.  Eat and drink lightly if you think you are going into labor.  If Braxton Hicks contractions are making you uncomfortable: ? Change your position from lying down or resting to walking, or change from walking to resting. ? Sit and rest in a tub of warm water. ? Drink enough fluid to keep your urine pale yellow. Dehydration may cause these contractions. ? Do slow and deep breathing several times an hour.  Keep all follow-up prenatal visits as told by your health care provider. This is important. Contact a health care provider if:  You have a fever.  You have continuous pain in your abdomen. Get help right away if:  Your contractions become stronger, more regular, and closer together.  You have fluid leaking or gushing from your vagina.  You pass blood-tinged mucus (bloody show).  You have bleeding from your vagina.  You have low back pain that you never had before.  You feel your baby's head pushing down and causing pelvic pressure.  Your baby is not moving inside you as much as it used to. Summary  Contractions that occur before labor are called Braxton Hicks contractions, false labor, or practice contractions.  Braxton Hicks contractions are usually shorter, weaker, farther apart, and less regular than true labor contractions. True labor contractions usually become progressively stronger and regular and they become more frequent.  Manage discomfort from [facility] contractions by changing position, resting in a warm bath, drinking plenty of water, or practicing deep breathing. This information is not intended to replace advice given to you by your health care provider. Make sure you discuss any questions you have with your  health care provider. Document Released: 06/17/2016 Document Revised: 06/17/2016 Document Reviewed: 06/17/2016 Elsevier Interactive Patient Education  2018 [employer].

## 2017-05-28 LAB — CBC
HEMOGLOBIN: 9.3 g/dL — AB (ref 11.1–15.9)
Hematocrit: 28.1 % — ABNORMAL LOW (ref 34.0–46.6)
MCH: 25.9 pg — AB (ref 26.6–33.0)
MCHC: 33.1 g/dL (ref 31.5–35.7)
MCV: 78 fL — ABNORMAL LOW (ref 79–97)
PLATELETS: 274 10*3/uL (ref 150–379)
RBC: 3.59 x10E6/uL — ABNORMAL LOW (ref 3.77–5.28)
RDW: 13.9 % (ref 12.3–15.4)
WBC: 7.5 10*3/uL (ref 3.4–10.8)

## 2017-05-28 LAB — HIV ANTIBODY (ROUTINE TESTING W REFLEX): HIV SCREEN 4TH GENERATION: NONREACTIVE

## 2017-05-28 LAB — GLUCOSE TOLERANCE, 1 HOUR: Glucose, 1Hr PP: 92 mg/dL (ref 65–199)

## 2017-05-28 LAB — RPR: RPR Ser Ql: NONREACTIVE

## 2017-05-30 ENCOUNTER — Encounter: Payer: Self-pay | Admitting: Nurse Practitioner

## 2017-05-30 DIAGNOSIS — O99013 Anemia complicating pregnancy, third trimester: Secondary | ICD-10-CM

## 2017-05-30 HISTORY — DX: Anemia complicating pregnancy, third trimester: O99.013

## 2017-06-14 ENCOUNTER — Encounter: Payer: Medicaid Other | Admitting: Student

## 2017-06-26 ENCOUNTER — Encounter (HOSPITAL_COMMUNITY): Payer: Self-pay | Admitting: *Deleted

## 2017-06-26 ENCOUNTER — Inpatient Hospital Stay (HOSPITAL_COMMUNITY)
Admission: AD | Admit: 2017-06-26 | Discharge: 2017-06-26 | Disposition: A | Discharge status: Home / Self Care | Payer: Medicaid Other | Source: Ambulatory Visit | Attending: Obstetrics & Gynecology | Admitting: Obstetrics & Gynecology

## 2017-06-26 DIAGNOSIS — O2313 Infections of bladder in pregnancy, third trimester: Secondary | ICD-10-CM | POA: Diagnosis not present

## 2017-06-26 DIAGNOSIS — R000 Tachycardia, unspecified: Secondary | ICD-10-CM | POA: Diagnosis not present

## 2017-06-26 DIAGNOSIS — O26893 Other specified pregnancy related conditions, third trimester: Secondary | ICD-10-CM | POA: Insufficient documentation

## 2017-06-26 DIAGNOSIS — A599 Trichomoniasis, unspecified: Secondary | ICD-10-CM

## 2017-06-26 DIAGNOSIS — O479 False labor, unspecified: Secondary | ICD-10-CM

## 2017-06-26 DIAGNOSIS — O4703 False labor before 37 completed weeks of gestation, third trimester: Secondary | ICD-10-CM | POA: Insufficient documentation

## 2017-06-26 DIAGNOSIS — Z3A36 36 weeks gestation of pregnancy: Secondary | ICD-10-CM | POA: Diagnosis not present

## 2017-06-26 DIAGNOSIS — N898 Other specified noninflammatory disorders of vagina: Secondary | ICD-10-CM

## 2017-06-26 DIAGNOSIS — O23593 Infection of other part of genital tract in pregnancy, third trimester: Secondary | ICD-10-CM | POA: Insufficient documentation

## 2017-06-26 DIAGNOSIS — N3000 Acute cystitis without hematuria: Secondary | ICD-10-CM

## 2017-06-26 DIAGNOSIS — O0933 Supervision of pregnancy with insufficient antenatal care, third trimester: Secondary | ICD-10-CM

## 2017-06-26 LAB — URINALYSIS, ROUTINE W REFLEX MICROSCOPIC
BILIRUBIN URINE: NEGATIVE
Glucose, UA: NEGATIVE mg/dL
HGB URINE DIPSTICK: NEGATIVE
KETONES UR: NEGATIVE mg/dL
NITRITE: NEGATIVE
PH: 7 (ref 5.0–8.0)
Protein, ur: NEGATIVE mg/dL
SPECIFIC GRAVITY, URINE: 1.011 (ref 1.005–1.030)
WBC, UA: >50 WBC/hpf — ABNORMAL HIGH (ref 0–5)

## 2017-06-26 LAB — RAPID URINE DRUG SCREEN, HOSP PERFORMED
AMPHETAMINES: NOT DETECTED
BARBITURATES: NOT DETECTED
BENZODIAZEPINES: NOT DETECTED
Cocaine: NOT DETECTED
OPIATES: NOT DETECTED
Tetrahydrocannabinol: NOT DETECTED

## 2017-06-26 LAB — POCT FERN TEST: POCT FERN TEST: NEGATIVE

## 2017-06-26 LAB — WET PREP, GENITAL
Clue Cells Wet Prep HPF POC: NONE SEEN
SPERM: NONE SEEN

## 2017-06-26 MED ORDER — METRONIDAZOLE 500 MG PO TABS
2000.0000 mg | ORAL_TABLET | Freq: Once | ORAL | Status: AC
  Administered 2017-06-26: 2000 mg via ORAL
  Filled 2017-06-26: qty 4

## 2017-06-26 MED ORDER — MICONAZOLE NITRATE 2 % VA CREA: 1.0000 | TOPICAL_CREAM | Freq: Every day | VAGINAL | 0 refills | Status: AC

## 2017-06-26 MED ORDER — CEPHALEXIN 500 MG PO CAPS
500.0000 mg | ORAL_CAPSULE | Freq: Four times a day (QID) | ORAL | 0 refills | Status: AC
Start: 2017-06-26 — End: 2017-07-03

## 2017-06-26 MED ORDER — LACTATED RINGERS IV BOLUS: 1000.0000 mL | Freq: Once | INTRAVENOUS | Status: DC

## 2017-06-26 NOTE — MAU Note (Signed)
Urine in lab 

## 2017-06-26 NOTE — MAU Note (Signed)
Pt. Refused IV fluids.  States she wants to PO hydrate at home.

## 2017-06-26 NOTE — Discharge Instructions (Signed)

## 2017-06-26 NOTE — MAU Note (Signed)
Patient c/o  Contractions and sharp abdominal pain Intermittent Past 2 days Rating pain 8/10 Worse with movements Has not taken anything for the pain  +increase in pressure  Reports light yellow discharge Thin  +FM

## 2017-06-26 NOTE — Progress Notes (Addendum)
Assumed care. Repositioned pt to side lying. EFM adjusted. Informed pt MD will be in room to assess shortly.   1016: MD at bs assessing.   VE closed/thick. Pt states now after VE that possibe ROM.   wetprep and fern collected.   1050: MD notified of pt's refusal of IV and desires to go home and PO hydrate. Water given.   Pt tolerating ice water. Crackers and gingerale given  1130: Provider at bs reassessing and discussing results and POC.  1153: Pending medication verification by pharmacy  1159: pharmacy notified of med verification.   Medicated per order  1212: d/c instructions given with pt understanding. Pt left unit via ambulatory with SO

## 2017-06-26 NOTE — MAU Provider Note (Signed)
Chief Complaint:  Contractions; vaginal pressure; and Vaginal Discharge   First Provider Initiated Contact with Patient 06/26/17 1028      HPI: Melinda Burch is a 27 y.o. G3P1011 at [redacted]w[redacted]d who presents to MAU reporting intermittent contractions and abdominal pain. States contractions have been worsening over the last 2 days. Rating pain 8/10. Located in lower abdomen and pelvis. Has not taken anything for pain. Endorsing some associated pressure as well. Walking also causes pain. Endorses poor appetite and PO intake.   Patient also states she has some discharge for the last 1.5 weeks. It is yellow and clumpy. Has a h/o yeast but was never treated for it. Denies leakage of fluid, fevers, dysuria, or vaginal bleeding. Good fetal movement.   Pregnancy Course:  Limited PNC with only two visits  Past Medical History: History reviewed. No pertinent past medical history.  Past obstetric history: OB History  Gravida Para Term Preterm AB Living  3 1 1   1 1   SAB TAB Ectopic Multiple Live Births    1   0 1    # Outcome Date GA Lbr Len/2nd Weight Sex Delivery Anes PTL Lv  3 Current           2 Term 10/14/15 [redacted]w[redacted]d 12:30 / 00:11 3.405 kg (7 lb 8.1 oz) M Vag-Spont None  LIV  1 TAB 03/2014            Past Surgical History: Past Surgical History:  Procedure Laterality Date  . DILATION AND CURETTAGE OF UTERUS       Family History: History reviewed. No pertinent family history.  Social History: Social History   Tobacco Use  . Smoking status: Never Smoker  . Smokeless tobacco: Never Used  Substance Use Topics  . Alcohol use: No  . Drug use: No    Allergies: No Known Allergies  Meds:  Medications Prior to Admission  Medication Sig Dispense Refill Last Dose  . Prenatal Vit-Fe Phos-FA-Omega (VITAFOL GUMMIES) 3.33-0.333-34.8 MG CHEW Chew 3 each by mouth daily. (Patient not taking: Reported on 04/05/2017) 90 tablet 11 Not Taking    I have reviewed patient's Past Medical Hx, Surgical  Hx, Family Hx, Social Hx, medications and allergies.   ROS:  All systems reviewed and are negative for acute change except as noted in the HPI.   Physical Exam   Patient Vitals for the past 24 hrs:  BP Temp Temp src Pulse Resp SpO2 Height Weight  06/26/17 1032 - - - - - 99 % - -  06/26/17 1022 - (!) 97.5 F (36.4 C) Axillary - - - - -  06/26/17 0948 111/73 98.2 F (36.8 C) Oral (!) 129 16 100 % 5\' 3"  (1.6 m) 62.7 kg (138 lb 1.9 oz)   Constitutional: Well-developed, well-nourished female in no acute distress.  HEENT: NCAT, EOMI, dry mucous membranes, o/p clear Cardiovascular: normal rate and rhythm, pulses intact Respiratory: normal rate and effort.  GI: Abd soft, non-tender, gravid appropriate for gestational age.  MS: Extremities nontender, no edema, normal ROM Neurologic: Alert and oriented x 4.  GU: Neg CVAT. Pelvic: NEFG, discharge present that is yellow and frothy with associated white clumps, no blood, cervix clean, thick and closed. No CMT. Psych: normal mood and affect Skin: warm and dry  Cervical exam: closed/thick    Labs: Results for orders placed or performed during the hospital encounter of 06/26/17  Wet prep, genital  Result Value Ref Range   Yeast Wet Prep HPF POC PRESENT (A)  NONE SEEN   Trich, Wet Prep PRESENT (A) NONE SEEN   Clue Cells Wet Prep HPF POC NONE SEEN NONE SEEN   WBC, Wet Prep HPF POC MANY (A) NONE SEEN   Sperm NONE SEEN   OB Urine Culture  Result Value Ref Range   Specimen Description      OB CLEAN CATCH Performed at [facility], [address], [place], [place] [address]    Special Requests      NONE Performed at [facility], [address] [place], [place] [address]    Culture >=100,000 COLONIES/mL ESCHERICHIA COLI (A)    Report Status 06/28/2017 FINAL    Organism ID, Bacteria ESCHERICHIA COLI (A)       Susceptibility   Escherichia coli - MIC*    AMPICILLIN <=2 SENSITIVE Sensitive     CEFAZOLIN <=4 SENSITIVE Sensitive      CEFEPIME <=1 SENSITIVE Sensitive     CEFTAZIDIME <=1 SENSITIVE Sensitive     CEFTRIAXONE <=1 SENSITIVE Sensitive     CIPROFLOXACIN <=0.25 SENSITIVE Sensitive     GENTAMICIN <=1 SENSITIVE Sensitive     IMIPENEM <=0.25 SENSITIVE Sensitive     TRIMETH/SULFA <=20 SENSITIVE Sensitive     AMPICILLIN/SULBACTAM <=2 SENSITIVE Sensitive     PIP/TAZO <=4 SENSITIVE Sensitive     Extended ESBL NEGATIVE Sensitive     * >=100,000 COLONIES/mL ESCHERICHIA COLI  Urinalysis, Routine w reflex microscopic  Result Value Ref Range   Color, Urine YELLOW YELLOW   APPearance CLOUDY (A) CLEAR   Specific Gravity, Urine 1.011 1.005 - 1.030   pH 7.0 5.0 - 8.0   Glucose, UA NEGATIVE NEGATIVE mg/dL   Hgb urine dipstick NEGATIVE NEGATIVE   Bilirubin Urine NEGATIVE NEGATIVE   Ketones, ur NEGATIVE NEGATIVE mg/dL   Protein, ur NEGATIVE NEGATIVE mg/dL   Nitrite NEGATIVE NEGATIVE   Leukocytes, UA LARGE (A) NEGATIVE   RBC / HPF 21-50 0 - 5 RBC/hpf   WBC, UA >50 (H) 0 - 5 WBC/hpf   Bacteria, UA MANY (A) NONE SEEN   Squamous Epithelial / LPF 21-50 0 - 5   Mucus PRESENT   Urine rapid drug screen (hosp performed)  Result Value Ref Range   Opiates NONE DETECTED NONE DETECTED   Cocaine NONE DETECTED NONE DETECTED   Benzodiazepines NONE DETECTED NONE DETECTED   Amphetamines NONE DETECTED NONE DETECTED   Tetrahydrocannabinol NONE DETECTED NONE DETECTED   Barbiturates NONE DETECTED NONE DETECTED  Fern Test  Result Value Ref Range   POCT Fern Test Negative = intact amniotic membranes      Imaging:  No results found.  MAU Management/MDM: Vitals and nursing notes reviewed Orders Placed This Encounter  Procedures  . Wet prep, genital  . OB Urine Culture  . Urinalysis, Routine w reflex microscopic  . Urine rapid drug screen (hosp performed)  . Fern Test    Meds ordered this encounter  Medications  . DISCONTD: lactated ringers bolus 1,000 mL  . metroNIDAZOLE (FLAGYL) tablet 2,000 mg  . cephALEXin (KEFLEX)  500 MG capsule    Sig: Take 1 capsule (500 mg total) by mouth 4 (four) times daily for 7 days.    Dispense:  28 capsule    Refill:  0  . miconazole (MONISTAT 7) 2 % vaginal cream    Sig: Place 1 Applicatorful vaginally at bedtime for 7 days.    Dispense:  45 g    Refill:  0   Patient with tachycardia and appears dry. Offered IV fluid  bolus but patient declined. Will PO hydrate. Tachycardia improved by discharge.   Wet prep came back positive for Trich and yeast. Patient treated with 2g of Flagyl here per her request. Partner given script for treatment. UA also showing UTI. Will treat for UTI. Will send for culture.  No signs of labor. Reassurance given to patient. Encouraged patient to schedule appointment for OB visit.   Plan of care reviewed with patient, including labs and tests ordered and medical treatment.  I personally reviewed the patient's NST today, found to be REACTIVE. 145 bpm, mod var, +accels, no decels. CTX: irregular, UI  ASSESSMENT 1. Vaginal discharge   2. Acute cystitis without hematuria   3. Trichimoniasis   4. Braxton Hicks contractions   5. Tachycardia   6. Limited prenatal care in third trimester     PLAN Discharge home in stable condition. Counseled on return precautions Rx for Keflex Rx for miconazole  Follow-up OB provider Handout given   Allergies as of 06/26/2017   No Known Allergies     Medication List    TAKE these medications   cephALEXin 500 MG capsule Commonly known as:  KEFLEX Take 1 capsule (500 mg total) by mouth 4 (four) times daily for 7 days.   miconazole 2 % vaginal cream Commonly known as:  MONISTAT 7 Place 1 Applicatorful vaginally at bedtime for 7 days.   VITAFOL GUMMIES 3.33-0.333-34.8 MG Chew Chew 3 each by mouth daily.        Luiz Blare, DO [facility], [facility]  06/26/2017, 10:32 AM

## 2017-06-28 LAB — CULTURE, OB URINE: Culture: >=100000 — AB

## 2017-07-07 ENCOUNTER — Ambulatory Visit (INDEPENDENT_AMBULATORY_CARE_PROVIDER_SITE_OTHER): Payer: Medicaid Other | Admitting: Student

## 2017-07-07 ENCOUNTER — Other Ambulatory Visit (HOSPITAL_COMMUNITY)
Admission: RE | Admit: 2017-07-07 | Discharge: 2017-07-07 | Disposition: A | Discharge status: Home / Self Care | Payer: Medicaid Other | Source: Ambulatory Visit | Attending: Student | Admitting: Student

## 2017-07-07 VITALS — BP 114/75 | HR 114 | Wt 138.3 lb

## 2017-07-07 DIAGNOSIS — N390 Urinary tract infection, site not specified: Secondary | ICD-10-CM

## 2017-07-07 DIAGNOSIS — Z3480 Encounter for supervision of other normal pregnancy, unspecified trimester: Secondary | ICD-10-CM | POA: Diagnosis not present

## 2017-07-07 DIAGNOSIS — A599 Trichomoniasis, unspecified: Secondary | ICD-10-CM

## 2017-07-07 DIAGNOSIS — Z3483 Encounter for supervision of other normal pregnancy, third trimester: Secondary | ICD-10-CM | POA: Diagnosis not present

## 2017-07-07 DIAGNOSIS — B952 Enterococcus as the cause of diseases classified elsewhere: Secondary | ICD-10-CM | POA: Diagnosis not present

## 2017-07-07 DIAGNOSIS — Z3A00 Weeks of gestation of pregnancy not specified: Secondary | ICD-10-CM | POA: Insufficient documentation

## 2017-07-07 HISTORY — DX: Trichomoniasis, unspecified: A59.9

## 2017-07-07 NOTE — Progress Notes (Signed)
   PRENATAL VISIT NOTE  Subjective:  Melinda Burch is a 27 y.o. G3P1011 at [redacted]w[redacted]d being seen today for ongoing prenatal care.  She is currently monitored for the following issues for this low-risk pregnancy and has ECZEMA; Supervision of other normal pregnancy, antepartum; Sickle cell trait ([place]); Rubella non-immune status, antepartum; Anemia affecting pregnancy in third trimester; UTI (urinary tract infection) due to Enterococcus; and Trichomoniasis on their problem list.  Patient reports no complaints. Just feeling tired. Has been taking her iron pills twice a day.  She has missed her appointments because of work.  Contractions: Irritability. Vag. Bleeding: None.  Movement: Present. Denies leaking of fluid.   The following portions of the patient's history were reviewed and updated as appropriate: allergies, current medications, past family history, past medical history, past social history, past surgical history and problem list. Problem list updated.  Objective:   Vitals:   07/07/17 1110  BP: 114/75  Pulse: (!) 114  Weight: 138 lb 4.8 oz (62.7 kg)    Fetal Status: Fetal Heart Rate (bpm): 140   Movement: Present  Presentation: Vertex  General:  Alert, oriented and cooperative. Patient is in no acute distress.  Skin: Skin is warm and dry. No rash noted.   Cardiovascular: Normal heart rate noted  Respiratory: Normal respiratory effort, no problems with respiration noted  Abdomen: Soft, gravid, appropriate for gestational age.  Pain/Pressure: Absent     Pelvic: Cervical exam deferred        Extremities: Normal range of motion.  Edema: None  Mental Status: Normal mood and affect. Normal behavior. Normal judgment and thought content.   Assessment and Plan:  Pregnancy: G3P1011 at [redacted]w[redacted]d  1. Supervision of other normal pregnancy, antepartum -Will schedule for antenatal testing if she progresses to 40 weeks - GC/Chlamydia probe amp ([place])not at [facility] - Culture, beta strep (group b  only) - CBC 2. UTI (urinary tract infection) due to Enterococcus -taking medication now; will do TOC in 3 weeks.   3. Trichomoniasis -patient wants TOC in two weeks.   Term labor symptoms and general obstetric precautions including but not limited to vaginal bleeding, contractions, leaking of fluid and fetal movement were reviewed in detail with the patient. Please refer to After Visit Summary for other counseling recommendations.  No follow-ups on file.  Future Appointments  Date Time Provider [place]  07/15/2017  9:55 AM Laury Deep, [place]    Mervyn Skeeters [place], [place]

## 2017-07-07 NOTE — Patient Instructions (Signed)
Group B Streptococcus Infection During Pregnancy Group B Streptococcus (GBS) is a type of bacteria (Streptococcus agalactiae) that is often found in healthy people, commonly in the rectum, vagina, and intestines. In people who are healthy and not pregnant, the bacteria rarely cause serious illness or complications. However, women who test positive for GBS during pregnancy can pass the bacteria to their baby during childbirth, which can cause serious infection in the baby after birth. Women with GBS may also have infections during their pregnancy or immediately after childbirth, such as such as urinary tract infections (UTIs) or infections of the uterus (uterine infections). Having GBS also increases a woman's risk of complications during pregnancy, such as early (preterm) labor or delivery, miscarriage, or stillbirth. Routine testing (screening) for GBS is recommended for all pregnant women. What increases the risk? You may have a higher risk for GBS infection during pregnancy if you had one during a past pregnancy. What are the signs or symptoms? In most cases, GBS infection does not cause symptoms in pregnant women. Signs and symptoms of a possible GBS-related infection may include:  Labor starting before the 37th week of pregnancy.  A UTI or bladder infection, which may cause: ? Fever. ? Pain or burning during urination. ? Frequent urination.  Fever during labor, along with: ? Bad-smelling discharge. ? Uterine tenderness. ? Rapid heartbeat in the mother, baby, or both.  Rare but serious symptoms of a possible GBS-related infection in women include:  Blood infection (septicemia). This may cause fever, chills, or confusion.  Lung infection (pneumonia). This may cause fever, chills, cough, rapid breathing, difficulty breathing, or chest pain.  Bone, joint, skin, or soft tissue infection.  How is this diagnosed? You may be screened for GBS between week 35 and week 37 of your pregnancy. If  you have symptoms of preterm labor, you may be screened earlier. This condition is diagnosed based on lab test results from:  A swab of fluid from the vagina and rectum.  A urine sample.  How is this treated? This condition is treated with antibiotic medicine. When you go into labor, or as soon as your water breaks (your membranes rupture), you will be given antibiotics through an IV tube. Antibiotics will continue until after you give birth. If you are having a cesarean delivery, you do not need antibiotics unless your membranes have already ruptured. Follow these instructions at home:  Take over-the-counter and prescription medicines only as told by your health care provider.  Take your antibiotic medicine as told by your health care provider. Do not stop taking the antibiotic even if you start to feel better.  Keep all pre-birth (prenatal) visits and follow-up visits as told by your health care provider. This is important. Contact a health care provider if:  You have pain or burning when you urinate.  You have to urinate frequently.  You have a fever or chills.  You develop a bad-smelling vaginal discharge. Get help right away if:  Your membranes rupture.  You go into labor.  You have severe pain in your abdomen.  You have difficulty breathing.  You have chest pain. This information is not intended to replace advice given to you by your health care provider. Make sure you discuss any questions you have with your health care provider. Document Released: 05/11/2007 Document Revised: 08/29/2015 Document Reviewed: 08/28/2015 Elsevier Interactive Patient Education  2018 [employer]  

## 2017-07-08 LAB — CBC
HEMOGLOBIN: 9 g/dL — AB (ref 11.1–15.9)
Hematocrit: 29.4 % — ABNORMAL LOW (ref 34.0–46.6)
MCH: 23.3 pg — ABNORMAL LOW (ref 26.6–33.0)
MCHC: 30.6 g/dL — AB (ref 31.5–35.7)
MCV: 76 fL — ABNORMAL LOW (ref 79–97)
Platelets: 257 10*3/uL (ref 150–450)
RBC: 3.86 x10E6/uL (ref 3.77–5.28)
RDW: 16.1 % — ABNORMAL HIGH (ref 12.3–15.4)
WBC: 6.2 10*3/uL (ref 3.4–10.8)

## 2017-07-08 LAB — GC/CHLAMYDIA PROBE AMP (~~LOC~~) NOT AT ARMC
CHLAMYDIA, DNA PROBE: NEGATIVE
NEISSERIA GONORRHEA: NEGATIVE

## 2017-07-11 LAB — CULTURE, BETA STREP (GROUP B ONLY): Strep Gp B Culture: NEGATIVE

## 2017-07-12 ENCOUNTER — Inpatient Hospital Stay (HOSPITAL_COMMUNITY)
Admission: AD | Admit: 2017-07-12 | Discharge: 2017-07-12 | Disposition: A | Discharge status: Home / Self Care | Payer: Medicaid Other | Source: Ambulatory Visit | Attending: Obstetrics and Gynecology | Admitting: Obstetrics and Gynecology

## 2017-07-12 ENCOUNTER — Encounter (HOSPITAL_COMMUNITY): Payer: Self-pay

## 2017-07-12 DIAGNOSIS — O479 False labor, unspecified: Secondary | ICD-10-CM

## 2017-07-12 DIAGNOSIS — O09899 Supervision of other high risk pregnancies, unspecified trimester: Secondary | ICD-10-CM

## 2017-07-12 DIAGNOSIS — Z3A38 38 weeks gestation of pregnancy: Secondary | ICD-10-CM | POA: Insufficient documentation

## 2017-07-12 DIAGNOSIS — O471 False labor at or after 37 completed weeks of gestation: Secondary | ICD-10-CM | POA: Diagnosis present

## 2017-07-12 DIAGNOSIS — Z283 Underimmunization status: Secondary | ICD-10-CM

## 2017-07-12 DIAGNOSIS — O9989 Other specified diseases and conditions complicating pregnancy, childbirth and the puerperium: Secondary | ICD-10-CM

## 2017-07-12 NOTE — MAU Note (Signed)
Pt reports she is having a lot of pressure in her vagina/pelvis

## 2017-07-12 NOTE — Discharge Instructions (Signed)
Braxton Hicks Contractions °Contractions of the uterus can occur throughout pregnancy, but they are not always a sign that you are in labor. You may have practice contractions called Braxton Hicks contractions. These false labor contractions are sometimes confused with true labor. °What are Braxton Hicks contractions? °Braxton Hicks contractions are tightening movements that occur in the muscles of the uterus before labor. Unlike true labor contractions, these contractions do not result in opening (dilation) and thinning of the cervix. Toward the end of pregnancy (32-34 weeks), Braxton Hicks contractions can happen more often and may become stronger. These contractions are sometimes difficult to tell apart from true labor because they can be very uncomfortable. You should not feel embarrassed if you go to the hospital with false labor. °Sometimes, the only way to tell if you are in true labor is for your health care provider to look for changes in the cervix. The health care provider will do a physical exam and may monitor your contractions. If you are not in true labor, the exam should show that your cervix is not dilating and your water has not broken. °If there are other health problems associated with your pregnancy, it is completely safe for you to be sent home with false labor. You may continue to have Braxton Hicks contractions until you go into true labor. °How to tell the difference between true labor and false labor °True labor °· Contractions last 30-70 seconds. °· Contractions become very regular. °· Discomfort is usually felt in the top of the uterus, and it spreads to the lower abdomen and low back. °· Contractions do not go away with walking. °· Contractions usually become more intense and increase in frequency. °· The cervix dilates and gets thinner. °False labor °· Contractions are usually shorter and not as strong as true labor contractions. °· Contractions are usually irregular. °· Contractions  are often felt in the front of the lower abdomen and in the groin. °· Contractions may go away when you walk around or change positions while lying down. °· Contractions get weaker and are shorter-lasting as time goes on. °· The cervix usually does not dilate or become thin. °Follow these instructions at home: °· Take over-the-counter and prescription medicines only as told by your health care provider. °· Keep up with your usual exercises and follow other instructions from your health care provider. °· Eat and drink lightly if you think you are going into labor. °· If Braxton Hicks contractions are making you uncomfortable: °? Change your position from lying down or resting to walking, or change from walking to resting. °? Sit and rest in a tub of warm water. °? Drink enough fluid to keep your urine pale yellow. Dehydration may cause these contractions. °? Do slow and deep breathing several times an hour. °· Keep all follow-up prenatal visits as told by your health care provider. This is important. °Contact a health care provider if: °· You have a fever. °· You have continuous pain in your abdomen. °Get help right away if: °· Your contractions become stronger, more regular, and closer together. °· You have fluid leaking or gushing from your vagina. °· You pass blood-tinged mucus (bloody show). °· You have bleeding from your vagina. °· You have low back pain that you never had before. °· You feel your baby’s head pushing down and causing pelvic pressure. °· Your baby is not moving inside you as much as it used to. °Summary °· Contractions that occur before labor are called Braxton   Hicks contractions, false labor, or practice contractions. °· Braxton Hicks contractions are usually shorter, weaker, farther apart, and less regular than true labor contractions. True labor contractions usually become progressively stronger and regular and they become more frequent. °· Manage discomfort from Braxton Hicks contractions by  changing position, resting in a warm bath, drinking plenty of water, or practicing deep breathing. °This information is not intended to replace advice given to you by your health care provider. Make sure you discuss any questions you have with your health care provider. °Document Released: 06/17/2016 Document Revised: 06/17/2016 Document Reviewed: 06/17/2016 °Elsevier Interactive Patient Education © 2018 Elsevier Inc. ° °

## 2017-07-14 ENCOUNTER — Other Ambulatory Visit: Payer: Self-pay

## 2017-07-14 ENCOUNTER — Inpatient Hospital Stay (HOSPITAL_COMMUNITY)
Admission: AD | Admit: 2017-07-14 | Discharge: 2017-07-16 | DRG: 806 | Disposition: A | Discharge status: Home / Self Care | Payer: Medicaid Other | Attending: Obstetrics and Gynecology | Admitting: Obstetrics and Gynecology

## 2017-07-14 ENCOUNTER — Encounter (HOSPITAL_COMMUNITY): Payer: Self-pay | Admitting: *Deleted

## 2017-07-14 DIAGNOSIS — O9832 Other infections with a predominantly sexual mode of transmission complicating childbirth: Secondary | ICD-10-CM | POA: Diagnosis present

## 2017-07-14 DIAGNOSIS — Z283 Underimmunization status: Secondary | ICD-10-CM

## 2017-07-14 DIAGNOSIS — O09899 Supervision of other high risk pregnancies, unspecified trimester: Secondary | ICD-10-CM

## 2017-07-14 DIAGNOSIS — A599 Trichomoniasis, unspecified: Secondary | ICD-10-CM | POA: Diagnosis present

## 2017-07-14 DIAGNOSIS — B952 Enterococcus as the cause of diseases classified elsewhere: Secondary | ICD-10-CM | POA: Diagnosis present

## 2017-07-14 DIAGNOSIS — O99013 Anemia complicating pregnancy, third trimester: Secondary | ICD-10-CM | POA: Diagnosis present

## 2017-07-14 DIAGNOSIS — Z3480 Encounter for supervision of other normal pregnancy, unspecified trimester: Secondary | ICD-10-CM

## 2017-07-14 DIAGNOSIS — Z3A39 39 weeks gestation of pregnancy: Secondary | ICD-10-CM

## 2017-07-14 DIAGNOSIS — O9902 Anemia complicating childbirth: Secondary | ICD-10-CM | POA: Diagnosis present

## 2017-07-14 DIAGNOSIS — Z2839 Other underimmunization status: Secondary | ICD-10-CM

## 2017-07-14 DIAGNOSIS — N390 Urinary tract infection, site not specified: Secondary | ICD-10-CM | POA: Diagnosis present

## 2017-07-14 DIAGNOSIS — O9989 Other specified diseases and conditions complicating pregnancy, childbirth and the puerperium: Secondary | ICD-10-CM

## 2017-07-14 DIAGNOSIS — D573 Sickle-cell trait: Secondary | ICD-10-CM | POA: Diagnosis present

## 2017-07-14 DIAGNOSIS — A5901 Trichomonal vulvovaginitis: Secondary | ICD-10-CM | POA: Diagnosis present

## 2017-07-14 HISTORY — DX: Anemia, unspecified: D64.9

## 2017-07-14 HISTORY — DX: Unspecified infectious disease: B99.9

## 2017-07-14 NOTE — MAU Note (Signed)
Pt reports uc's q 3-4 minutes for almost 2 hours. Denies LOF or bleeding. + FM

## 2017-07-14 NOTE — H&P (Signed)
Melinda Burch is a 27 y.o. female G3P1011 with IUP at [redacted]w[redacted]d presenting for contractions. Pt states she has been having regular, every 2-3 minutes contractions, associated with none vaginal bleeding for 2-3 hours..  Membranes are intact, with active fetal movement.   [facility] since 25 wks  Prenatal History/Complications:  Late [facility] Short interval bt pregnancies Trichomonas (tx'd 5/12)  Wet prep tonight is still +. Pt states she has not had intercourse since taking the meds, so will infuse 1gm IV and rx sent to pharmacy for 500mg  BID X 1 week upon discharge.   Past Medical History: History reviewed. No pertinent past medical history.  Past Surgical History: Past Surgical History:  Procedure Laterality Date  . DILATION AND CURETTAGE OF UTERUS      Obstetrical History: OB History    Gravida  3   Para  1   Term  1   Preterm      AB  1   Living  1     SAB      TAB  1   Ectopic      Multiple  0   Live Births  1            Social History: Social History   Socioeconomic History  . Marital status: Single    Spouse name: Not on file  . Number of children: Not on file  . Years of education: Not on file  . Highest education level: Not on file  Occupational History  . Not on file  Social Needs  . Financial resource strain: Not on file  . Food insecurity:    Worry: Not on file    Inability: Not on file  . Transportation needs:    Medical: Not on file    Non-medical: Not on file  Tobacco Use  . Smoking status: Never Smoker  . Smokeless tobacco: Never Used  Substance and Sexual Activity  . Alcohol use: No  . Drug use: No  . Sexual activity: Yes  Lifestyle  . Physical activity:    Days per week: Not on file    Minutes per session: Not on file  . Stress: Not on file  Relationships  . Social connections:    Talks on phone: Not on file    Gets together: Not on file    Attends religious service: Not on file    Active member of club or organization:  Not on file    Attends meetings of clubs or organizations: Not on file    Relationship status: Not on file  Other Topics Concern  . Not on file  Social History Narrative  . Not on file    Family History: No family history on file.  Allergies: No Known Allergies  Medications Prior to Admission  Medication Sig Dispense Refill Last Dose  . Prenatal Vit-Fe Phos-FA-Omega (VITAFOL GUMMIES) 3.33-0.333-34.8 MG CHEW Chew 3 each by mouth daily. 90 tablet 11 Past Month at Unknown time        Review of Systems   Constitutional: Negative for fever and chills Eyes: Negative for visual disturbances Respiratory: Negative for shortness of breath, dyspnea Cardiovascular: Negative for chest pain or palpitations  Gastrointestinal: Negative for vomiting, diarrhea and constipation.  POSITIVE for abdominal pain (contractions) Genitourinary: Negative for dysuria and urgency Musculoskeletal: Negative for back pain, joint pain, myalgias  Neurological: Negative for dizziness and headaches      Blood pressure 119/81, pulse (!) 112, temperature 98.9 F (37.2 C), resp. rate  20, last menstrual period 10/12/2016, SpO2 100 %, currently breastfeeding. General appearance: alert, cooperative and no distress Lungs: clear to auscultation bilaterally Heart: regular rate and rhythm Abdomen: soft, non-tender; bowel sounds normal Extremities: Homans sign is negative, no sign of DVT DTR's 2+ Presentation: cephalic Fetal monitoring  Baseline: 150 bpm, Variability: Good {> 6 bpm), Accelerations: Reactive and Decelerations: Absent Uterine activity  2-3 minutes Dilation: 5.5 Effacement (%): 80 Station: -3 Exam by:: B Mosca RN    Prenatal labs: ABO, Rh: A/Positive/-- (02/19 0954) Antibody: Negative (02/19 0954) Rubella: 0.97 (02/19 0954) RPR: Non Reactive (04/12 1547)  HBsAg: Negative (02/19 0954)  HIV: Non Reactive (04/12 1547)    Clinic  [facility] Prenatal Labs  Dating  BY LMP confirmed by anatomy US  at 25 w 6d Blood type:A positive     Genetic Screen  Antibody: negative  Anatomic [place]  04-11-17 Normal  27w 4d by LMP is BEDC Rubella:  NON IMMUNE  GTT Early:               Third trimester:  1 hour was 35 RPR:   NR  Flu vaccine  HBsAg:   neg  TDaP vaccine   declined                                     Rhogam:n/a HIV:  neg  Baby Food  Breast                                              GBS: (For PCN allergy, check sensitivities)  Contraception  None--FOB says they plan to "repopulate the world" Pap:06/2015, wnl  Circumcision   Yes-outpt   Pediatrician   [facility] CF: Neg  Support Person  tashun (FOB) SMA: Neg  Prenatal Classes  Hgb electrophoresis: Sickle Cell Trait    Prenatal Transfer Tool  Maternal Diabetes: No Genetic Screening: Declined Maternal Ultrasounds/Referrals: Normal Fetal Ultrasounds or other Referrals:  None Maternal Substance Abuse:  No Significant Maternal Medications:  None Significant Maternal Lab Results: Lab values include: Group B Strep negative     No results found for this or any previous visit (from the past 24 hour(s)).  Assessment: Melinda Burch is a 27 y.o. G3P1011 with an IUP at [redacted]w[redacted]d presenting for labor  Plan: #Labor: expectant management #Pain:  Per request #FWB Cat 1  Christin Fudge 07/14/2017, 11:56 PM

## 2017-07-15 ENCOUNTER — Encounter: Payer: Medicaid Other | Admitting: Obstetrics and Gynecology

## 2017-07-15 ENCOUNTER — Encounter (HOSPITAL_COMMUNITY): Payer: Self-pay

## 2017-07-15 DIAGNOSIS — A5901 Trichomonal vulvovaginitis: Secondary | ICD-10-CM | POA: Diagnosis present

## 2017-07-15 DIAGNOSIS — O9832 Other infections with a predominantly sexual mode of transmission complicating childbirth: Secondary | ICD-10-CM | POA: Diagnosis present

## 2017-07-15 DIAGNOSIS — Z3483 Encounter for supervision of other normal pregnancy, third trimester: Secondary | ICD-10-CM | POA: Diagnosis present

## 2017-07-15 DIAGNOSIS — Z3A39 39 weeks gestation of pregnancy: Secondary | ICD-10-CM | POA: Diagnosis not present

## 2017-07-15 DIAGNOSIS — D573 Sickle-cell trait: Secondary | ICD-10-CM | POA: Diagnosis present

## 2017-07-15 DIAGNOSIS — O9902 Anemia complicating childbirth: Secondary | ICD-10-CM | POA: Diagnosis present

## 2017-07-15 LAB — TYPE AND SCREEN
ABO/RH(D): A POS
Antibody Screen: NEGATIVE

## 2017-07-15 LAB — RPR: RPR: NONREACTIVE

## 2017-07-15 LAB — CBC
HCT: 33.1 % — ABNORMAL LOW (ref 36.0–46.0)
Hemoglobin: 10.6 g/dL — ABNORMAL LOW (ref 12.0–15.0)
MCH: 23.9 pg — ABNORMAL LOW (ref 26.0–34.0)
MCHC: 32 g/dL (ref 30.0–36.0)
MCV: 74.5 fL — ABNORMAL LOW (ref 78.0–100.0)
PLATELETS: 310 10*3/uL (ref 150–400)
RBC: 4.44 MIL/uL (ref 3.87–5.11)
RDW: 15.6 % — ABNORMAL HIGH (ref 11.5–15.5)
WBC: 10.3 10*3/uL (ref 4.0–10.5)

## 2017-07-15 MED ORDER — BENZOCAINE-MENTHOL 20-0.5 % EX AERO: 1.0000 "application " | INHALATION_SPRAY | CUTANEOUS | Status: DC | PRN

## 2017-07-15 MED ORDER — DOCUSATE SODIUM 100 MG PO CAPS
100.0000 mg | ORAL_CAPSULE | Freq: Two times a day (BID) | ORAL | Status: DC
  Administered 2017-07-15 – 2017-07-16 (×2): 100 mg via ORAL
  Filled 2017-07-15 (×2): qty 1

## 2017-07-15 MED ORDER — TETANUS-DIPHTH-ACELL PERTUSSIS 5-2.5-18.5 LF-MCG/0.5 IM SUSP: 0.5000 mL | Freq: Once | INTRAMUSCULAR | Status: DC

## 2017-07-15 MED ORDER — COCONUT OIL OIL
1.0000 "application " | TOPICAL_OIL | Status: DC | PRN
  Administered 2017-07-15: 1 via TOPICAL
  Filled 2017-07-15: qty 120

## 2017-07-15 MED ORDER — HYDROXYZINE HCL 50 MG PO TABS
50.0000 mg | ORAL_TABLET | Freq: Four times a day (QID) | ORAL | Status: DC | PRN
  Filled 2017-07-15: qty 1

## 2017-07-15 MED ORDER — ONDANSETRON HCL 4 MG/2ML IJ SOLN: 4.0000 mg | INTRAMUSCULAR | Status: DC | PRN

## 2017-07-15 MED ORDER — ZOLPIDEM TARTRATE 5 MG PO TABS: 5.0000 mg | ORAL_TABLET | Freq: Every evening | ORAL | Status: DC | PRN

## 2017-07-15 MED ORDER — ONDANSETRON HCL 4 MG/2ML IJ SOLN: 4.0000 mg | Freq: Four times a day (QID) | INTRAMUSCULAR | Status: DC | PRN

## 2017-07-15 MED ORDER — BISACODYL 10 MG RE SUPP: 10.0000 mg | Freq: Every day | RECTAL | Status: DC | PRN

## 2017-07-15 MED ORDER — ACETAMINOPHEN 325 MG PO TABS
650.0000 mg | ORAL_TABLET | ORAL | Status: DC | PRN
  Administered 2017-07-15 (×2): 650 mg via ORAL
  Filled 2017-07-15 (×2): qty 2

## 2017-07-15 MED ORDER — LACTATED RINGERS IV SOLN: 500.0000 mL | INTRAVENOUS | Status: DC | PRN

## 2017-07-15 MED ORDER — DIBUCAINE 1 % RE OINT: 1.0000 "application " | TOPICAL_OINTMENT | RECTAL | Status: DC | PRN

## 2017-07-15 MED ORDER — OXYTOCIN 40 UNITS IN LACTATED RINGERS INFUSION - SIMPLE MED
2.5000 [IU]/h | INTRAVENOUS | Status: DC
  Administered 2017-07-15: 2.5 [IU]/h via INTRAVENOUS

## 2017-07-15 MED ORDER — METRONIDAZOLE 500 MG PO TABS: 500.0000 mg | ORAL_TABLET | Freq: Two times a day (BID) | ORAL | 0 refills | Status: DC

## 2017-07-15 MED ORDER — FLEET ENEMA 7-19 GM/118ML RE ENEM: 1.0000 | ENEMA | RECTAL | Status: DC | PRN

## 2017-07-15 MED ORDER — PRENATAL MULTIVITAMIN CH
1.0000 | ORAL_TABLET | Freq: Every day | ORAL | Status: DC
  Administered 2017-07-15 – 2017-07-16 (×2): 1 via ORAL
  Filled 2017-07-15 (×2): qty 1

## 2017-07-15 MED ORDER — FERROUS SULFATE 325 (65 FE) MG PO TABS
325.0000 mg | ORAL_TABLET | Freq: Two times a day (BID) | ORAL | Status: DC
  Administered 2017-07-15 – 2017-07-16 (×3): 325 mg via ORAL
  Filled 2017-07-15 (×3): qty 1

## 2017-07-15 MED ORDER — OXYTOCIN BOLUS FROM INFUSION
500.0000 mL | Freq: Once | INTRAVENOUS | Status: AC
  Administered 2017-07-15: 500 mL via INTRAVENOUS

## 2017-07-15 MED ORDER — FENTANYL CITRATE (PF) 100 MCG/2ML IJ SOLN
INTRAMUSCULAR | Status: AC
  Administered 2017-07-15: 100 ug via INTRAVENOUS
  Filled 2017-07-15: qty 2

## 2017-07-15 MED ORDER — SIMETHICONE 80 MG PO CHEW: 80.0000 mg | CHEWABLE_TABLET | ORAL | Status: DC | PRN

## 2017-07-15 MED ORDER — FLEET ENEMA 7-19 GM/118ML RE ENEM: 1.0000 | ENEMA | Freq: Every day | RECTAL | Status: DC | PRN

## 2017-07-15 MED ORDER — OXYCODONE HCL 5 MG PO TABS: 10.0000 mg | ORAL_TABLET | ORAL | Status: DC | PRN

## 2017-07-15 MED ORDER — METRONIDAZOLE IN NACL 5-0.79 MG/ML-% IV SOLN
500.0000 mg | Freq: Two times a day (BID) | INTRAVENOUS | Status: DC
  Administered 2017-07-15: 500 mg via INTRAVENOUS
  Filled 2017-07-15 (×2): qty 100

## 2017-07-15 MED ORDER — OXYCODONE HCL 5 MG PO TABS: 5.0000 mg | ORAL_TABLET | ORAL | Status: DC | PRN

## 2017-07-15 MED ORDER — OXYCODONE-ACETAMINOPHEN 5-325 MG PO TABS: 1.0000 | ORAL_TABLET | ORAL | Status: DC | PRN

## 2017-07-15 MED ORDER — METHYLERGONOVINE MALEATE 0.2 MG PO TABS: 0.2000 mg | ORAL_TABLET | ORAL | Status: DC | PRN

## 2017-07-15 MED ORDER — FENTANYL CITRATE (PF) 100 MCG/2ML IJ SOLN
50.0000 ug | INTRAMUSCULAR | Status: DC | PRN
  Administered 2017-07-15: 100 ug via INTRAVENOUS

## 2017-07-15 MED ORDER — OXYCODONE-ACETAMINOPHEN 5-325 MG PO TABS: 2.0000 | ORAL_TABLET | ORAL | Status: DC | PRN

## 2017-07-15 MED ORDER — LACTATED RINGERS IV SOLN
INTRAVENOUS | Status: DC
  Administered 2017-07-15 (×2): via INTRAVENOUS

## 2017-07-15 MED ORDER — IBUPROFEN 600 MG PO TABS
600.0000 mg | ORAL_TABLET | Freq: Four times a day (QID) | ORAL | Status: DC
  Administered 2017-07-15 – 2017-07-16 (×5): 600 mg via ORAL
  Filled 2017-07-15 (×5): qty 1

## 2017-07-15 MED ORDER — WITCH HAZEL-GLYCERIN EX PADS: 1.0000 "application " | MEDICATED_PAD | CUTANEOUS | Status: DC | PRN

## 2017-07-15 MED ORDER — ACETAMINOPHEN 325 MG PO TABS: 650.0000 mg | ORAL_TABLET | ORAL | Status: DC | PRN

## 2017-07-15 MED ORDER — MEASLES, MUMPS & RUBELLA VAC ~~LOC~~ INJ
0.5000 mL | INJECTION | Freq: Once | SUBCUTANEOUS | Status: DC
  Filled 2017-07-15: qty 0.5

## 2017-07-15 MED ORDER — DIPHENHYDRAMINE HCL 25 MG PO CAPS: 25.0000 mg | ORAL_CAPSULE | Freq: Four times a day (QID) | ORAL | Status: DC | PRN

## 2017-07-15 MED ORDER — ONDANSETRON HCL 4 MG PO TABS
4.0000 mg | ORAL_TABLET | ORAL | Status: DC | PRN
Start: 2017-07-15 — End: 2017-07-16

## 2017-07-15 MED ORDER — LIDOCAINE HCL (PF) 1 % IJ SOLN
30.0000 mL | INTRAMUSCULAR | Status: DC | PRN
  Filled 2017-07-15: qty 30

## 2017-07-15 MED ORDER — SOD CITRATE-CITRIC ACID 500-334 MG/5ML PO SOLN: 30.0000 mL | ORAL | Status: DC | PRN

## 2017-07-15 MED ORDER — METHYLERGONOVINE MALEATE 0.2 MG/ML IJ SOLN: 0.2000 mg | INTRAMUSCULAR | Status: DC | PRN

## 2017-07-15 NOTE — Lactation Note (Signed)
This note was copied from a baby's chart. Lactation Consultation Note  Patient Name: Melinda Burch VCBSW'H Date: 07/15/2017 Reason for consult: Initial assessment;Term  56 hours old FT female who is being exclusively BF by his mom, she's a P2 and experienced BF. She was able to BF her first child for 10 months and she already knows how to hand express. She doesn't have a pump at home, but she participated in the [facility] program at [facility] for this pregnancy. A hand pump from the hospital was given to her, pump instructions, cleaning and storage were reviewed.  LC went to the room twice, mom was asleep both time, but the second one, LC had to gently wake mom up because baby had not had a feeding in > 4 hours. Offered assistance with latch, LC took baby STS to mom's left breast in football position and baby latched on right away. A few swallows were head, mom only needed some assistance to align baby's body during feeding. Baby still nursing when exiting the room.  Encouraged mom to feed baby 8-12 times/24 hours or sooner if feeding cues are present. If baby is not cueing mom will undress him down to his diaper and hat and place him STS to the breast to give him an opportunity to feed. Discussed BF brochure, BF resources and feeding diary. Mom is aware of [place] services and will call PRN.    Maternal Data Formula Feeding for Exclusion: No Has patient been taught Hand Expression?: Yes Does the patient have breastfeeding experience prior to this delivery?: Yes  Feeding Feeding Type: Breast Fed Length of feed: 10 min(baby still nursing when exiting the room)  LATCH Score Latch: Grasps breast easily, tongue down, lips flanged, rhythmical sucking.  Audible Swallowing: A few with stimulation  Type of Nipple: Everted at rest and after stimulation  Comfort (Breast/Nipple): Soft / non-tender  Hold (Positioning): Assistance needed to correctly position infant at breast and maintain  latch.  LATCH Score: 8  Interventions Interventions: Breast feeding basics reviewed;Assisted with latch;Skin to skin;Breast massage;Breast compression;Support pillows;Hand pump;Adjust position  Lactation Tools Discussed/Used WIC Program: Yes Pump Review: Setup, frequency, and cleaning;Milk Storage Initiated by:: MPeck Date initiated:: 07/15/17   Consult Status Consult Status: Follow-up Date: 07/16/17 Follow-up type: In-patient    Melinda Burch 07/15/2017, 1:50 PM

## 2017-07-16 MED ORDER — IBUPROFEN 600 MG PO TABS: 600.0000 mg | ORAL_TABLET | Freq: Four times a day (QID) | ORAL | 0 refills | Status: DC | PRN

## 2017-07-16 NOTE — Discharge Instructions (Signed)
Vaginal Delivery, Care After °Refer to this sheet in the next few weeks. These instructions provide you with information about caring for yourself after vaginal delivery. Your health care provider may also give you more specific instructions. Your treatment has been planned according to current medical practices, but problems sometimes occur. Call your health care provider if you have any problems or questions. °What can I expect after the procedure? °After vaginal delivery, it is common to have: °· Some bleeding from your vagina. °· Soreness in your abdomen, your vagina, and the area of skin between your vaginal opening and your anus (perineum). °· Pelvic cramps. °· Fatigue. ° °Follow these instructions at home: °Medicines °· Take over-the-counter and prescription medicines only as told by your health care provider. °· If you were prescribed an antibiotic medicine, take it as told by your health care provider. Do not stop taking the antibiotic until it is finished. °Driving ° °· Do not drive or operate heavy machinery while taking prescription pain medicine. °· Do not drive for 24 hours if you received a sedative. °Lifestyle °· Do not drink alcohol. This is especially important if you are breastfeeding or taking medicine to relieve pain. °· Do not use tobacco products, including cigarettes, chewing tobacco, or e-cigarettes. If you need help quitting, ask your health care provider. °Eating and drinking °· Drink at least 8 eight-ounce glasses of water every day unless you are told not to by your health care provider. If you choose to breastfeed your baby, you may need to drink more water than this. °· Eat high-fiber foods every day. These foods may help prevent or relieve constipation. High-fiber foods include: °? Whole grain cereals and breads. °? Brown rice. °? Beans. °? Fresh fruits and vegetables. °Activity °· Return to your normal activities as told by your health care provider. Ask your health care provider  what activities are safe for you. °· Rest as much as possible. Try to rest or take a nap when your baby is sleeping. °· Do not lift anything that is heavier than your baby or 10 lb (4.5 kg) until your health care provider says that it is safe. °· Talk with your health care provider about when you can engage in sexual activity. This may depend on your: °? Risk of infection. °? Rate of healing. °? Comfort and desire to engage in sexual activity. °Vaginal Care °· If you have an episiotomy or a vaginal tear, check the area every day for signs of infection. Check for: °? More redness, swelling, or pain. °? More fluid or blood. °? Warmth. °? Pus or a bad smell. °· Do not use tampons or douches until your health care provider says this is safe. °· Watch for any blood clots that may pass from your vagina. These may look like clumps of dark red, brown, or black discharge. °General instructions °· Keep your perineum clean and dry as told by your health care provider. °· Wear loose, comfortable clothing. °· Wipe from front to back when you use the toilet. °· Ask your health care provider if you can shower or take a bath. If you had an episiotomy or a perineal tear during labor and delivery, your health care provider may tell you not to take baths for a certain length of time. °· Wear a bra that supports your breasts and fits you well. °· If possible, have someone help you with household activities and help care for your baby for at least a few days after   you leave the hospital. °· Keep all follow-up visits for you and your baby as told by your health care provider. This is important. °Contact a health care provider if: °· You have: °? Vaginal discharge that has a bad smell. °? Difficulty urinating. °? Pain when urinating. °? A sudden increase or decrease in the frequency of your bowel movements. °? More redness, swelling, or pain around your episiotomy or vaginal tear. °? More fluid or blood coming from your episiotomy or  vaginal tear. °? Pus or a bad smell coming from your episiotomy or vaginal tear. °? A fever. °? A rash. °? Little or no interest in activities you used to enjoy. °? Questions about caring for yourself or your baby. °· Your episiotomy or vaginal tear feels warm to the touch. °· Your episiotomy or vaginal tear is separating or does not appear to be healing. °· Your breasts are painful, hard, or turn red. °· You feel unusually sad or worried. °· You feel nauseous or you vomit. °· You pass large blood clots from your vagina. If you pass a blood clot from your vagina, save it to show to your health care provider. Do not flush blood clots down the toilet without having your health care provider look at them. °· You urinate more than usual. °· You are dizzy or light-headed. °· You have not breastfed at all and you have not had a menstrual period for 12 weeks after delivery. °· You have stopped breastfeeding and you have not had a menstrual period for 12 weeks after you stopped breastfeeding. °Get help right away if: °· You have: °? Pain that does not go away or does not get better with medicine. °? Chest pain. °? Difficulty breathing. °? Blurred vision or spots in your vision. °? Thoughts about hurting yourself or your baby. °· You develop pain in your abdomen or in one of your legs. °· You develop a severe headache. °· You faint. °· You bleed from your vagina so much that you fill two sanitary pads in one hour. °This information is not intended to replace advice given to you by your health care provider. Make sure you discuss any questions you have with your health care provider. °Document Released: 01/30/2000 Document Revised: 07/16/2015 Document Reviewed: 02/16/2015 °Elsevier Interactive Patient Education © 2018 Elsevier Inc. ° °

## 2017-07-16 NOTE — Lactation Note (Signed)
This note was copied from a baby's chart. Lactation Consultation Note  Patient Name: Melinda Burch ZJIRC'V Date: 07/16/2017 Reason for consult: Follow-up assessment Baby is at a 5% weight loss at 32 hours.  Mom states he fed well and frequently during the night.  She denies questions or concerns.  Lactation outpatient services and support information reviewed and encouraged prn.  Maternal Data    Feeding Feeding Type: Breast Fed Length of feed: 20 min  LATCH Score                   Interventions    Lactation Tools Discussed/Used     Consult Status Consult Status: Complete Follow-up type: Call as needed    Ave Filter 07/16/2017, 10:14 AM

## 2017-07-16 NOTE — Clinical Social Work Maternal (Signed)
CLINICAL SOCIAL WORK MATERNAL/CHILD NOTE  Patient Details  Name: Melinda Burch MRN: [id number] Date of Birth: 10-16-1990  Date:  07/16/2017  Clinical Social Worker Initiating Note:  Melinda Burch, MSW, LCSW-A Date/Time: Initiated:  07/16/17/1316     Child's Name:  Melinda Burch   Biological Parents:  Father, Mother   Need for Interpreter:  None   Reason for Referral:  Late or No Prenatal Care    Address:  [address] [place] [address]   Phone number:  [phone](home)     Additional phone number:   Household Members/Support Persons (HM/SP):   Household Member/Support Person 1   HM/SP Name Relationship DOB or Age  HM/SP -1 Melinda Burch FOB    HM/SP -2        HM/SP -3        HM/SP -4        HM/SP -5        HM/SP -6        HM/SP -7        HM/SP -8          Natural Supports (not living in the home):  Extended Family, Friends, Immediate Family   Professional Supports: None   Employment: Unemployed   Type of Work:     Education:  HProgrammer, systems  Homebound arranged:    FMuseum/gallery curatorResources:  MKohl's  Other Resources:  [employer] FPhysicist, medical   Cultural/Religious Considerations Which May Impact Care: None listed in chart, but per patient's request she is refusing immunizations  Strengths:  Ability to meet basic needs , PLexicographerchosen, Home prepared for child    Psychotropic Medications:   None      Pediatrician:    [place]olicitor[place]  [facility]     Pediatrician Fax Number:    Risk Factors/Current Problems:      Cognitive State:  Alert , Able to Concentrate    Mood/Affect:  Calm , Comfortable    CSW Assessment: CSW received consult for patient due to limited prenatal care. CSW met with patient, newborn NMacky Burch and two year old son at bedside. Patient stated she is doing  well, and has felt good since giving birth. CSW explained to patient hospital drug screening policies for urine and cord, patient did not have additional questions. CSW informed patient of negative UDS for patient and that the cord results will be monitored and a report will be made if warranted. Patient was understanding of this. Patient stated that [facility] but was told there was a [facility] list and to call on Monday for an appointment. Patient stated [facility] Patient has a used car seat for patient for safe transportation, CSW reviewed briefly the laws regarding car seats. Patient reports having a bassinet for infant to sleep in, SIDS thoroughly reviewed with patient. Patient reports having a good support system including the FOB, Melinda Mylarand her family. Patient is currently unemployed. Patient receives [employer] and Medicaid. CSW educated patient on how to add newborn to all those cases. Patient did not have additional concerns about cord results. CSW discussed PMAD's with patient. Patient did not have any questions for CSW, CSW encouraged her  to reach out if needs or questions arise before or after discharge.  CSW to continue monitoring cord tissue for results and will make report if necessary.  CSW Plan/Description:  No Further Intervention Required/No Barriers to Discharge, Perinatal Mood and Anxiety Disorder (PMADs) Education, Sudden Infant Death Syndrome (SIDS) Education, CSW Will Continue to Monitor Umbilical Cord Tissue Drug Screen Results and Make Report if Melinda Burch, Melinda Burch 07/16/2017, 1:22 PM

## 2017-07-16 NOTE — Discharge Summary (Signed)
OB Discharge Summary     Patient Name: Melinda Burch DOB: Feb 26, 1990 MRN: [id number]  Date of admission: 07/14/2017 Delivering MD: Christin Fudge   Date of discharge: 07/16/2017  Admitting diagnosis: 39 WEEKS CTX Intrauterine pregnancy: [redacted]w[redacted]d     Secondary diagnosis:  Active Problems:   Supervision of other normal pregnancy, antepartum   Sickle cell trait ([place])   Rubella non-immune status, antepartum   Anemia affecting pregnancy in third trimester   UTI (urinary tract infection) due to Enterococcus   Trichomoniasis   Normal labor  Additional problems: none     Discharge diagnosis: Term Pregnancy Delivered                                                                                                Post partum procedures:none  Augmentation: AROM  Complications: None  Hospital course:  Onset of Labor With Vaginal Delivery     27 y.o. yo [id number] at [redacted]w[redacted]d was admitted in Active Labor on 07/14/2017. Patient had an uncomplicated labor course and delivered within 2 hrs of admission. She had tested positive for trichomonas on 5/12 and was tx; she has not had intercourse since the tx but tested positive on admission; was given IV Flagyl and prescribed a po course to take upon discharge. She was also tx for a UTI on 5/12. Membrane Rupture Time/Date: 1:38 AM ,07/15/2017   Intrapartum Procedures: Episiotomy: None [1]                                         Lacerations:  None [1]  Patient had a delivery of a Viable infant. 07/15/2017  Information for the patient's newborn:  Kazoua, Gossen [[id number]]  Delivery Method: Vaginal, Spontaneous(Filed from Delivery Summary)    Pateint had an uncomplicated postpartum course.  She is ambulating, tolerating a regular diet, passing flatus, and urinating well. Patient is discharged home in stable condition on 07/16/17.   Physical exam  Vitals:   07/15/17 0305 07/15/17 0415 07/15/17 0822 07/16/17 0516  BP: 122/84 109/82 118/77  112/83  Pulse: 87 (!) 112 100 82  Resp: 17 20 18 16   Temp: 98.9 F (37.2 C) 99 F (37.2 C) 98.2 F (36.8 C) 98 F (36.7 C)  TempSrc: Oral Oral Oral Oral  SpO2: 100%      General: alert and cooperative Lochia: appropriate Uterine Fundus: firm Incision: N/A DVT Evaluation: No evidence of DVT seen on physical exam. Labs: Lab Results  Component Value Date   WBC 10.3 07/14/2017   HGB 10.6 (L) 07/14/2017   HCT 33.1 (L) 07/14/2017   MCV 74.5 (L) 07/14/2017   PLT 310 07/14/2017   CMP Latest Ref Rng & Units 02/18/2015  Glucose 65 - 99 mg/dL 111(H)  BUN 6 - 20 mg/dL 8  Creatinine 0.44 - 1.00 mg/dL 0.63  Sodium 135 - 145 mmol/L 139  Potassium 3.5 - 5.1 mmol/L 4.2  Chloride 101 - 111 mmol/L 104  CO2 22 - 32 mmol/L 22  Calcium 8.9 -  10.3 mg/dL 10.1  Total Protein 6.5 - 8.1 g/dL 7.9  Total Bilirubin 0.3 - 1.2 mg/dL 0.7  Alkaline Phos 38 - 126 U/L 35(L)  AST 15 - 41 U/L 20  ALT 14 - 54 U/L 14    Discharge instruction: per After Visit Summary and "Baby and Me Booklet".  After visit meds:  Allergies as of 07/16/2017   No Known Allergies     Medication List    TAKE these medications   ibuprofen 600 MG tablet Commonly known as:  ADVIL,MOTRIN Take 1 tablet (600 mg total) by mouth every 6 (six) hours as needed.   metroNIDAZOLE 500 MG tablet Commonly known as:  FLAGYL Take 1 tablet (500 mg total) by mouth 2 (two) times daily.       Diet: routine diet  Activity: Advance as tolerated. Pelvic rest for 6 weeks.   Outpatient follow up:4 weeks Follow up Appt:No future appointments. Follow up Visit:No follow-ups on file.  Postpartum contraception: None  Newborn Data: Live born female  Birth Weight: 7 lb 14.1 oz (3575 g) APGAR: 9, 9  Newborn Delivery   Birth date/time:  07/15/2017 01:42:00 Delivery type:  Vaginal, Spontaneous     Baby Feeding: Breast Disposition:home with mother   07/16/2017 Serita Grammes, CNM 8:59 AM

## 2017-07-17 ENCOUNTER — Ambulatory Visit: Payer: Self-pay

## 2017-07-17 NOTE — Lactation Note (Signed)
This note was copied from a baby's chart. Lactation Consultation Note  Patient Name: Melinda Burch [id number] Date: 07/17/2017 Reason for consult: Follow-up assessment Mom reports baby is feeding well.  No questions or concerns.  Lactation outpatient services and support reviewed and encouraged prn.  Maternal Data    Feeding Feeding Type: Breast Fed Length of feed: 20 min  LATCH Score                   Interventions    Lactation Tools Discussed/Used     Consult Status Consult Status: Complete Follow-up type: Call as needed    Ave Filter 07/17/2017, 9:43 AM

## 2017-08-25 ENCOUNTER — Encounter: Payer: Self-pay | Admitting: Obstetrics & Gynecology

## 2017-08-25 ENCOUNTER — Ambulatory Visit: Payer: Medicaid Other | Admitting: Nurse Practitioner

## 2017-12-12 ENCOUNTER — Encounter: Payer: Self-pay | Admitting: *Deleted

## 2018-08-22 ENCOUNTER — Encounter: Payer: Self-pay | Admitting: *Deleted

## 2018-08-28 ENCOUNTER — Emergency Department (HOSPITAL_COMMUNITY)
Admission: EM | Admit: 2018-08-28 | Discharge: 2018-08-29 | Disposition: A | Discharge status: Home / Self Care | Payer: Medicaid Other | Attending: Emergency Medicine | Admitting: Emergency Medicine

## 2018-08-28 ENCOUNTER — Other Ambulatory Visit: Payer: Self-pay

## 2018-08-28 DIAGNOSIS — Y939 Activity, unspecified: Secondary | ICD-10-CM | POA: Insufficient documentation

## 2018-08-28 DIAGNOSIS — S80862A Insect bite (nonvenomous), left lower leg, initial encounter: Secondary | ICD-10-CM

## 2018-08-28 DIAGNOSIS — W57XXXA Bitten or stung by nonvenomous insect and other nonvenomous arthropods, initial encounter: Secondary | ICD-10-CM | POA: Insufficient documentation

## 2018-08-28 DIAGNOSIS — Y92007 Garden or yard of unspecified non-institutional (private) residence as the place of occurrence of the external cause: Secondary | ICD-10-CM | POA: Insufficient documentation

## 2018-08-28 DIAGNOSIS — Y999 Unspecified external cause status: Secondary | ICD-10-CM | POA: Diagnosis not present

## 2018-08-28 MED ORDER — CEPHALEXIN 500 MG PO CAPS: 500.0000 mg | ORAL_CAPSULE | Freq: Two times a day (BID) | ORAL | 0 refills | Status: DC

## 2018-08-28 MED ORDER — CEPHALEXIN 250 MG PO CAPS
250.0000 mg | ORAL_CAPSULE | Freq: Once | ORAL | Status: AC
  Administered 2018-08-29: 250 mg via ORAL
  Filled 2018-08-28: qty 1

## 2018-08-28 NOTE — ED Provider Notes (Signed)
[facility] DEPT Provider Note   CSN: [id number] Arrival date & time: 08/28/18  2112     History   Chief Complaint Chief Complaint  Patient presents with  . Insect Bite    HPI Melinda Burch is a 28 y.o. female.     Patient presents to the emergency department with a chief complaint of insect bite.  She states that she noticed the bite on her left lower leg.  It is been gradually worsening over the past few days.  She states that she saw a brown spider in her yard.  She did not see a spider bite her.  She states that her child also has a bite mark on his head.  She reports some associated pain at the site.  Denies any fevers.  Denies taking anything for the symptoms.  Denies any other associated symptoms.  The history is provided by the patient. No language interpreter was used.    Past Medical History:  Diagnosis Date  . Anemia   . Infection    UTI/ Trich    Patient Active Problem List   Diagnosis Date Noted  . Normal labor 07/15/2017  . UTI (urinary tract infection) due to Enterococcus 07/07/2017  . Trichomoniasis 07/07/2017  . Anemia affecting pregnancy in third trimester 05/30/2017  . Rubella non-immune status, antepartum 04/28/2017  . Supervision of other normal pregnancy, antepartum 04/05/2017  . Sickle cell trait ([place]) 04/05/2017  . ECZEMA 10/07/2007    Past Surgical History:  Procedure Laterality Date  . DILATION AND CURETTAGE OF UTERUS       OB History    Gravida  3   Para  2   Term  2   Preterm      AB  1   Living  2     SAB      TAB  1   Ectopic      Multiple  0   Live Births  2            Home Medications    Prior to Admission medications   Medication Sig Start Date End Date Taking? Authorizing Provider  cephALEXin (KEFLEX) 500 MG capsule Take 1 capsule (500 mg total) by mouth 2 (two) times daily. 08/28/18   Montine Circle, PA-C  ibuprofen (ADVIL,MOTRIN) 600 MG tablet Take 1 tablet (600 mg  total) by mouth every 6 (six) hours as needed. 07/16/17   Myrtis Ser, CNM  metroNIDAZOLE (FLAGYL) 500 MG tablet Take 1 tablet (500 mg total) by mouth 2 (two) times daily. 07/15/17   Cresenzo-Dishmon, Joaquim Lai, CNM    Family History No family history on file.  Social History Social History   Tobacco Use  . Smoking status: Never Smoker  . Smokeless tobacco: Never Used  Substance Use Topics  . Alcohol use: No  . Drug use: No     Allergies   Patient has no known allergies.   Review of Systems Review of Systems  Constitutional: Negative for chills and fever.  Skin: Positive for color change and wound. Negative for rash.     Physical Exam Updated Vital Signs BP 132/80 (BP Location: Left Arm)   Pulse 100   Temp 98.8 F (37.1 C) (Oral)   Resp 14   Ht 5\' 4"  (1.626 m)   Wt 59 kg   LMP 08/05/2018   SpO2 98%   BMI 22.31 kg/m   Physical Exam Vitals signs and nursing note reviewed.  Constitutional:  General: She is not in acute distress.    Appearance: She is well-developed.  HENT:     Head: Normocephalic and atraumatic.  Eyes:     Conjunctiva/sclera: Conjunctivae normal.  Neck:     Musculoskeletal: Neck supple.  Cardiovascular:     Rate and Rhythm: Normal rate and regular rhythm.     Heart sounds: No murmur.  Pulmonary:     Effort: Pulmonary effort is normal. No respiratory distress.  Abdominal:     General: There is no distension.     Palpations: Abdomen is soft.  Skin:    General: Skin is warm and dry.     Comments: Small erythematous region on left anterior shin, nonfluctuant, mild surrounding cellulitis  Neurological:     Mental Status: She is alert.  Psychiatric:        Mood and Affect: Mood normal.        Behavior: Behavior normal.      ED Treatments / Results  Labs (all labs ordered are listed, but only abnormal results are displayed) Labs Reviewed - No data to display  EKG None  Radiology No results found.  Procedures Procedures  (including critical care time)  Medications Ordered in ED Medications  cephALEXin (KEFLEX) capsule 250 mg (has no administration in time range)     Initial Impression / Assessment and Plan / ED Course  I have reviewed the triage vital signs and the nursing notes.  Pertinent labs & imaging results that were available during my care of the patient were reviewed by me and considered in my medical decision making (see chart for details).        Patient with bite of some kind with minimal surrounding cellulitis on her left lower leg.  No indication of abscess requiring incision and drainage.  Will treat with antibiotics.  Final Clinical Impressions(s) / ED Diagnoses   Final diagnoses:  Insect bite of left lower leg, initial encounter    ED Discharge Orders         Ordered    cephALEXin (KEFLEX) 500 MG capsule  2 times daily     08/28/18 2340           Montine Circle, PA-C 08/28/18 2343    Palumbo, April, MD 08/29/18 0015

## 2018-08-28 NOTE — ED Triage Notes (Signed)
Pt reports to having insect bite on left lower leg from a possible spider appx 3 days ago.

## 2018-11-13 ENCOUNTER — Telehealth: Payer: Self-pay

## 2019-04-20 IMAGING — US US OB COMP LESS 14 WK
1 series · 15 of 28 positions shown · non-contrast
Comparison: None.

CLINICAL DATA: Abdominal pain, spotting

EXAM:
OBSTETRIC <14 WK US AND TRANSVAGINAL OB US
TECHNIQUE: Both transabdominal and transvaginal ultrasound examinations were
performed for complete evaluation of the gestation as well as the
maternal uterus, adnexal regions, and pelvic cul-de-sac.
Transvaginal technique was performed to assess early pregnancy.

[Series 1: us ob comp less 14 wk · 15 of 75 slices shown]
[im 1/75]
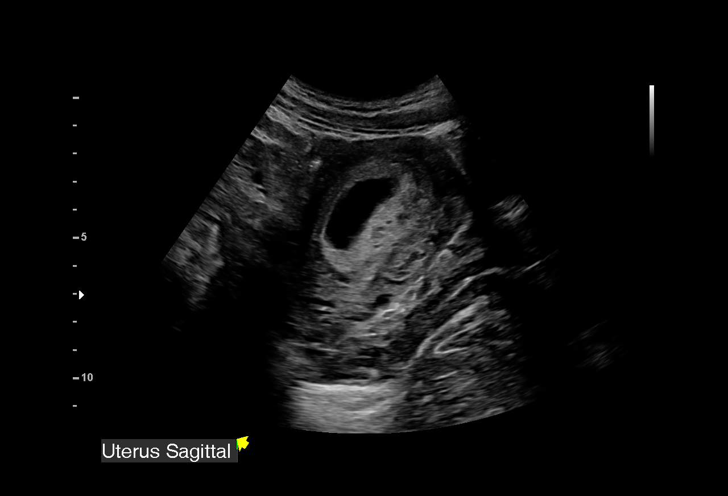
[im 6/75]
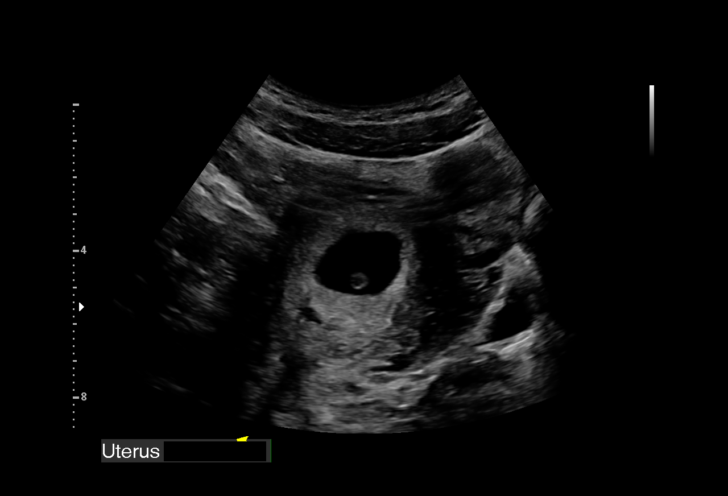
[im 11/75]
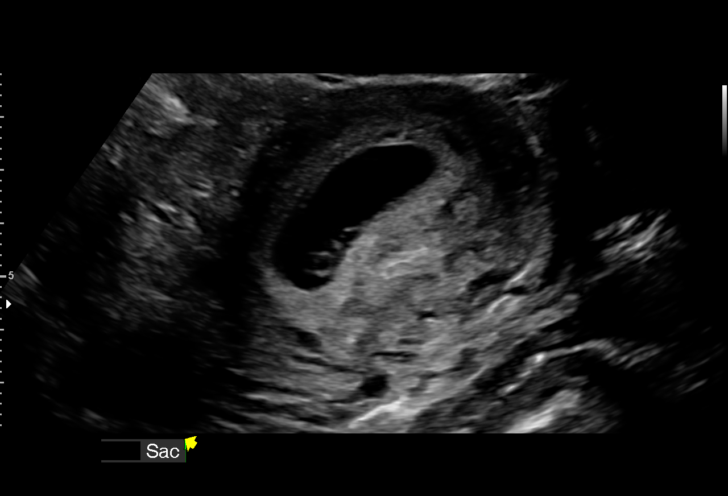
[im 17/75]
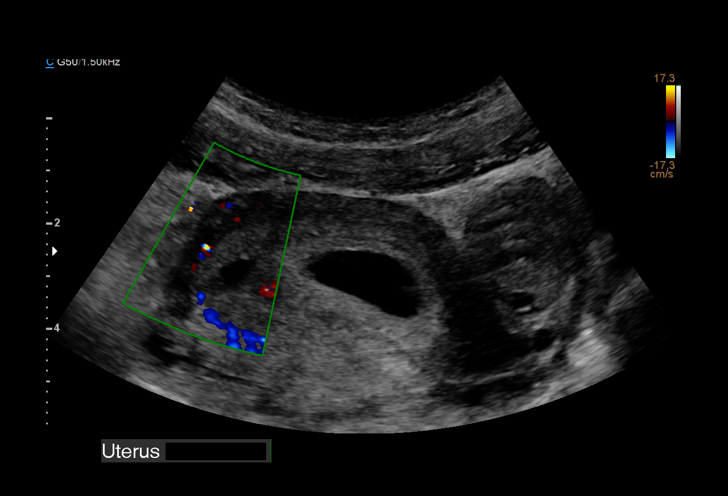
[im 22/75]
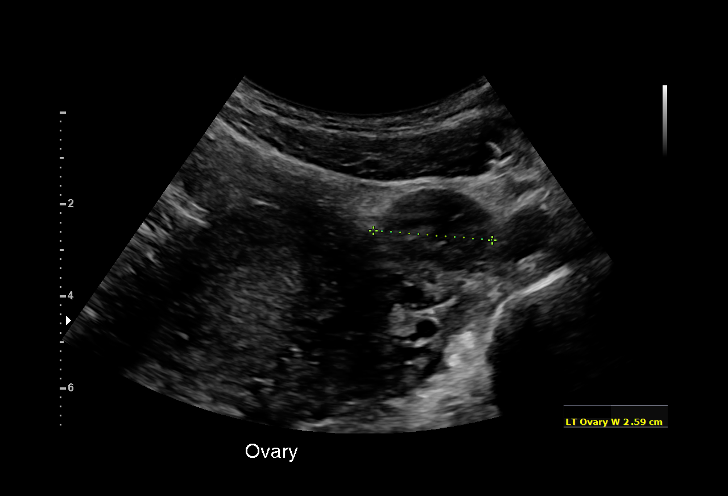
[im 28/75]
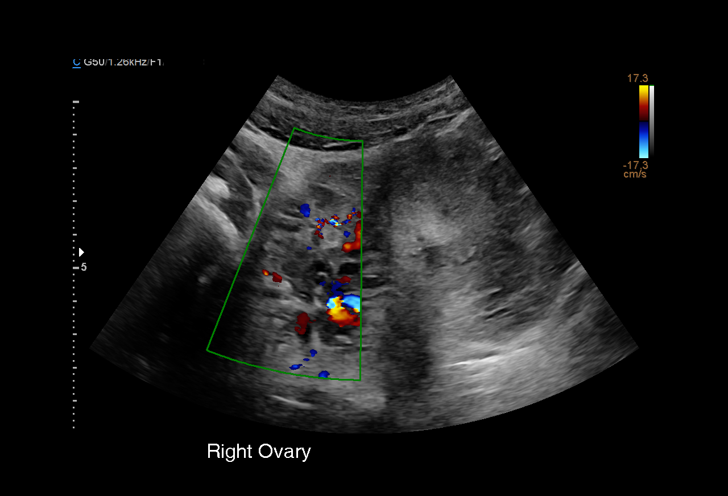
[im 33/75]
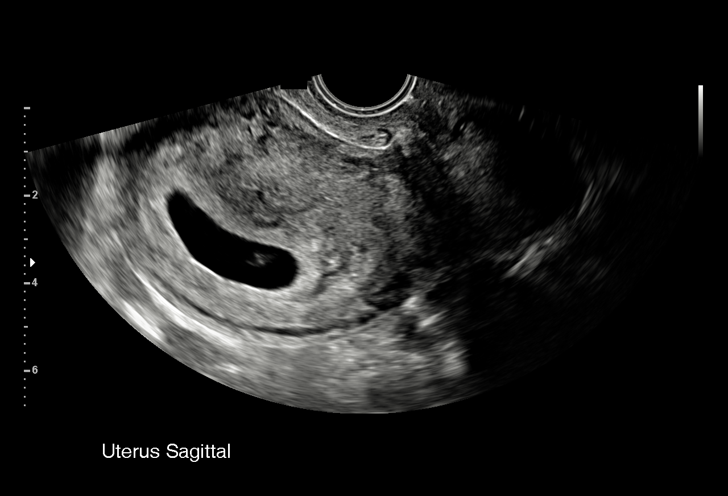
[im 39/75]
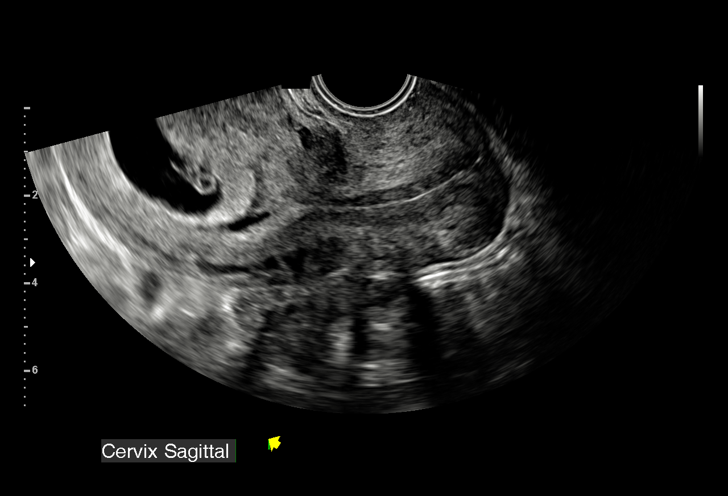
[im 42/75]
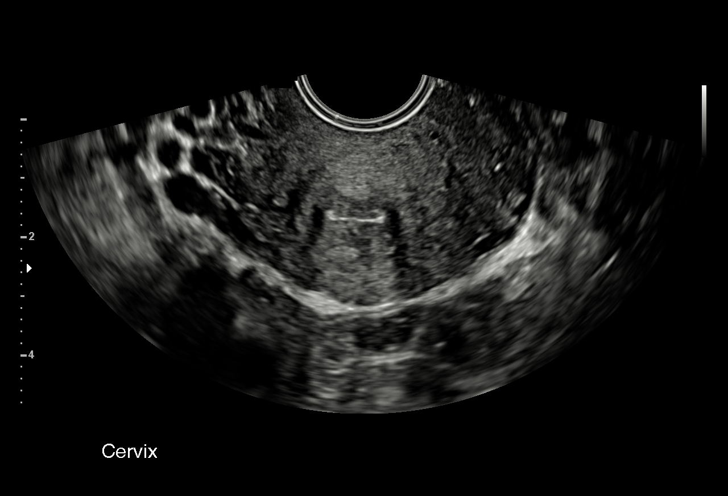
[im 47/75]
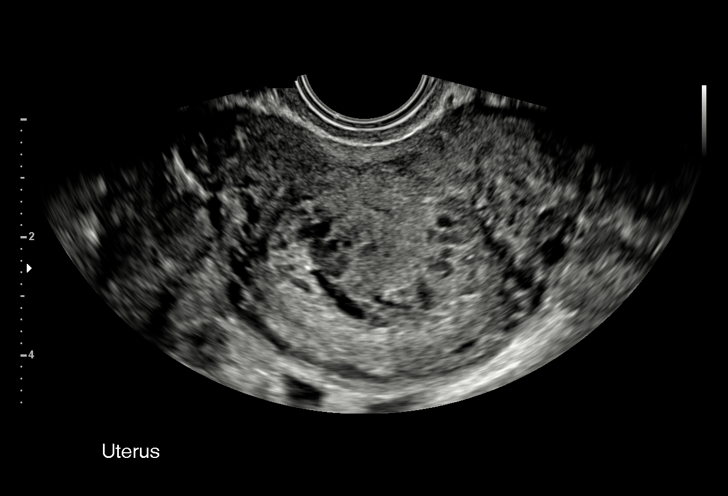
[im 53/75]
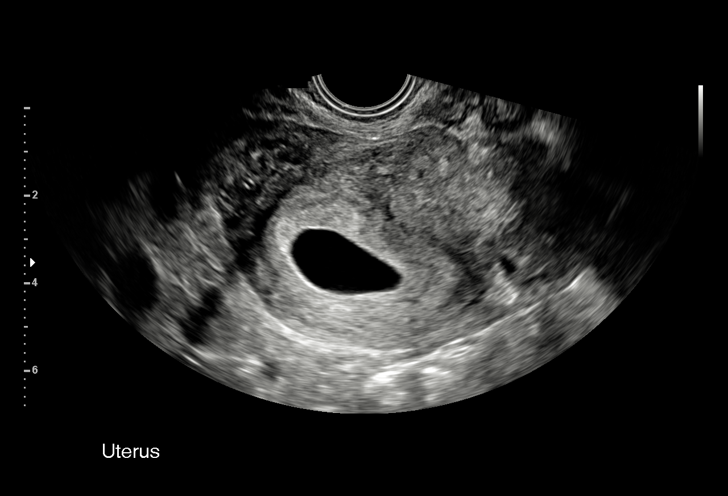
[im 58/75]
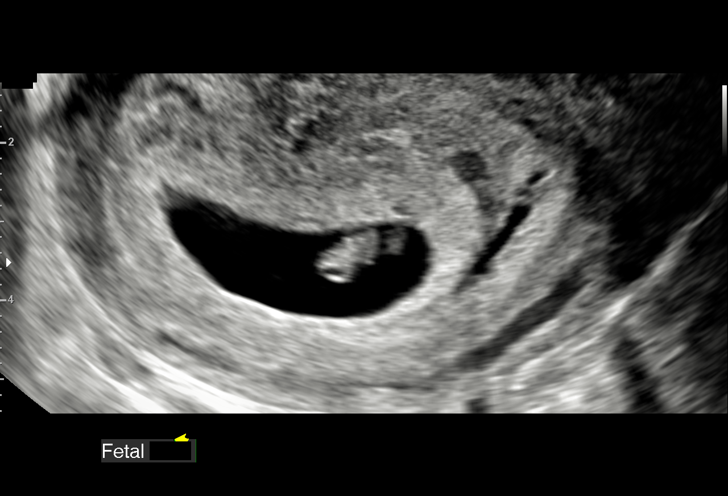
[im 64/75]
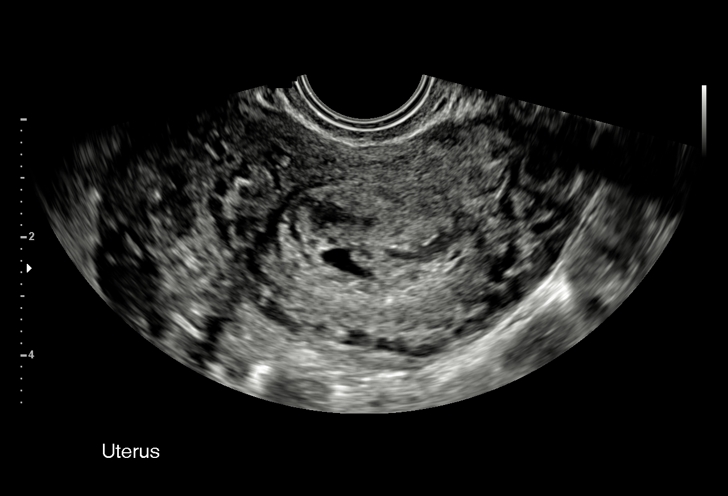
[im 69/75]
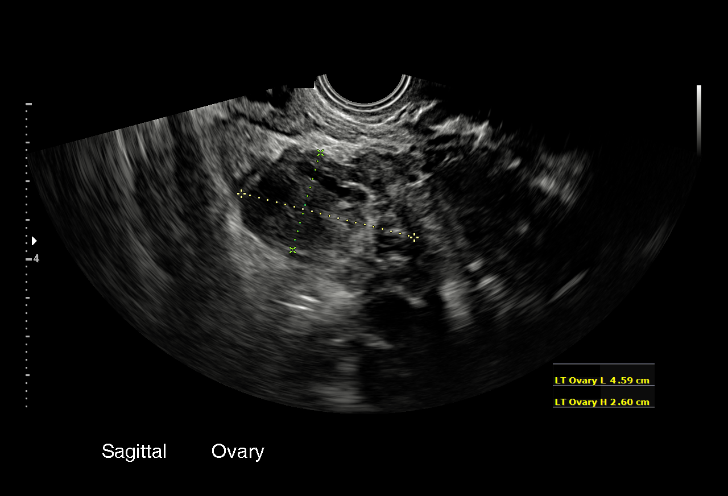
[im 75/75]
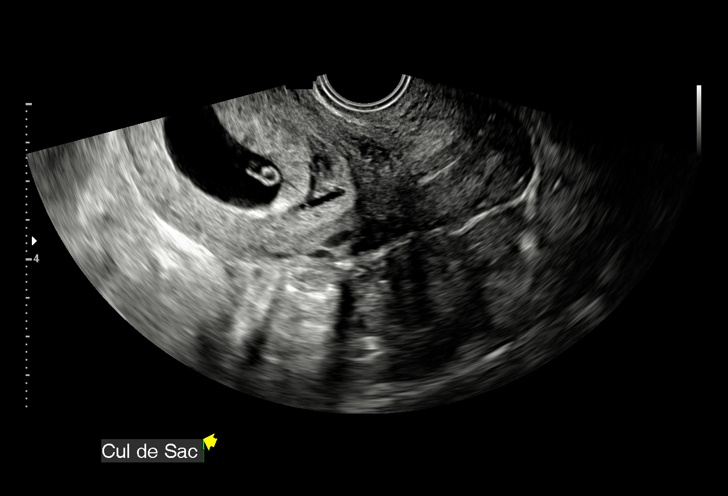

[15 of 28 positions shown; findings below may reference images not displayed]

FINDINGS: Intrauterine gestational sac: Visualized, single

Yolk sac:  Visualized

Embryo:  Visualized

Cardiac Activity: Visualized

Heart Rate: 131  bpm

MSD:   mm    w     d

CRL:  9  mm   6 w   6 d                  US EDC: 07/20/2017

Subchorionic hemorrhage:  Small subchorionic hemorrhage

Maternal uterus/adnexae: No adnexal mass or free fluid.
IMPRESSION: Six week 6 day intrauterine pregnancy. Fetal heart rate 131 beats
per minute. Small subchorionic hemorrhage.

## 2019-08-30 IMAGING — US US MFM OB COMP +14 WKS
1 series · 14 of 28 positions shown · non-contrast
Comparison: none

[Series 1: us mfm ob comp +14 wks · 120 acquisitions, 14 frames shown]
[im 5/120]
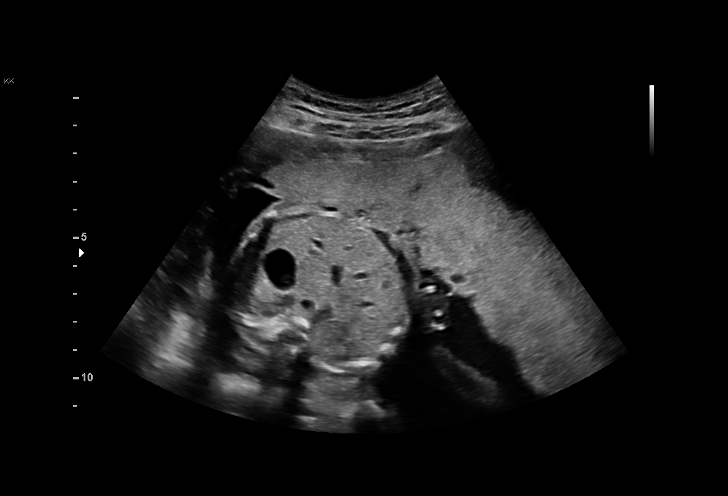
[im 14/120]
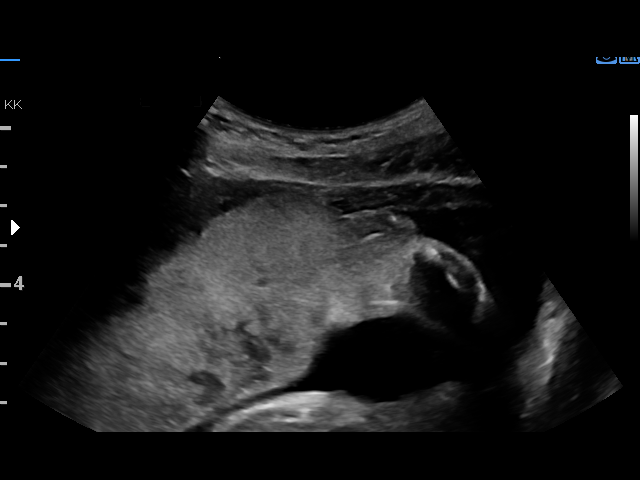
[im 23/120]
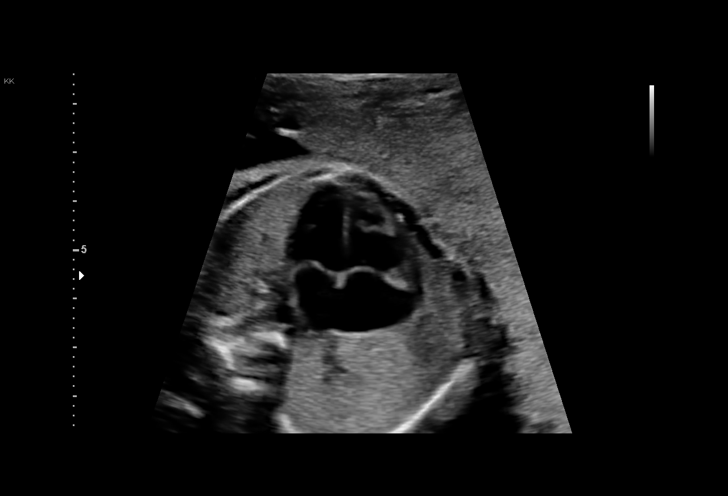
[im 31/120]
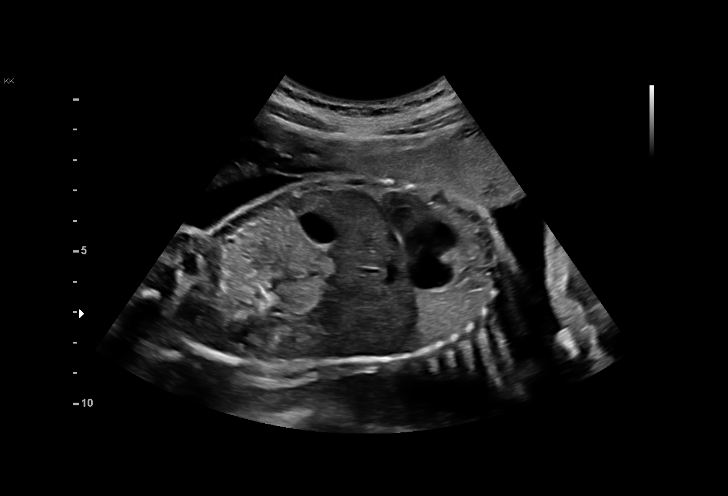
[im 40/120]
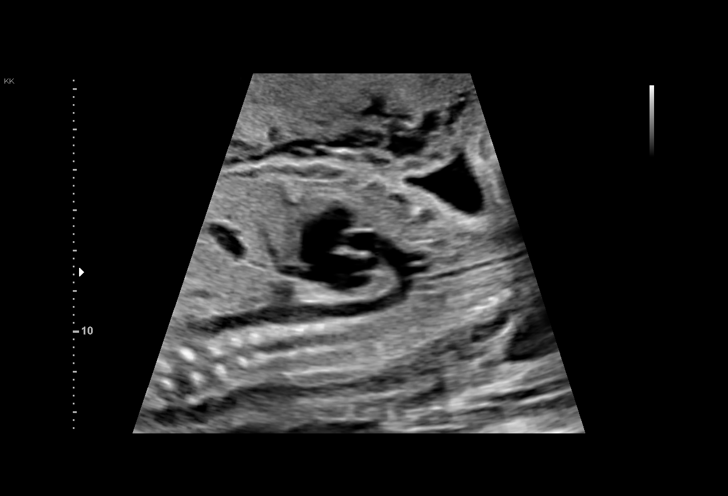
[im 49/120]
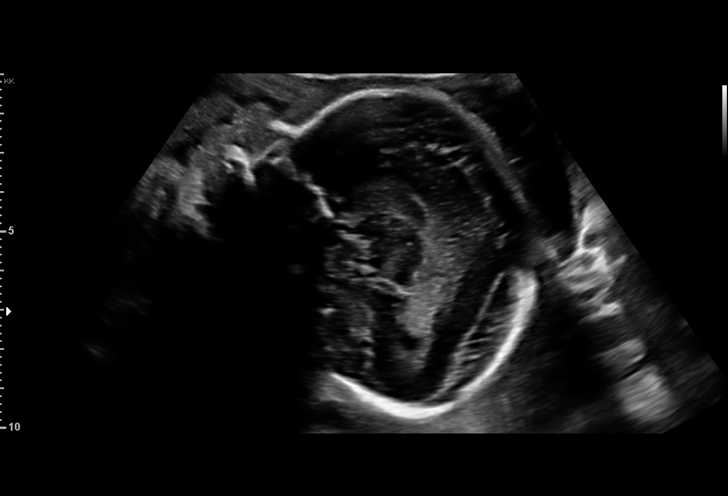
[im 58/120]
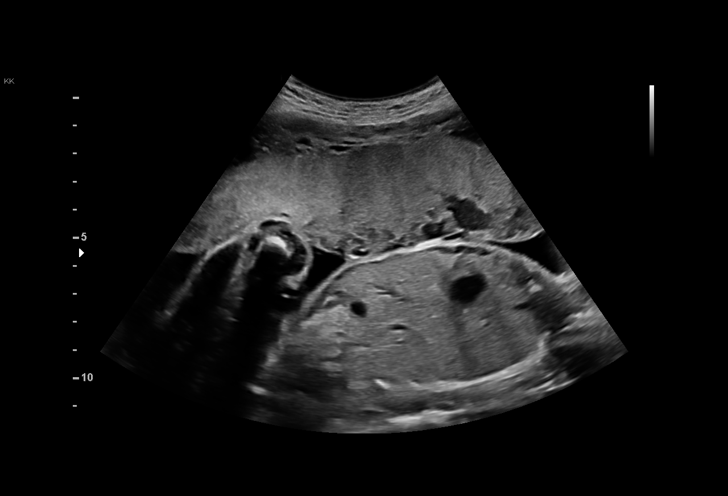
[im 67/120]
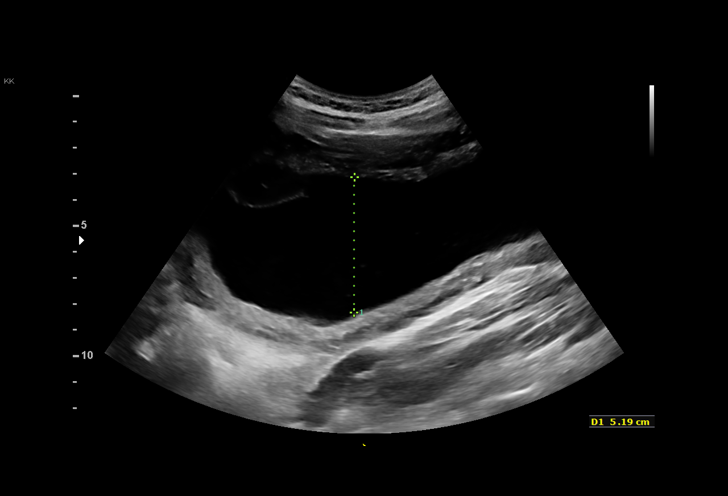
[im 75/120]
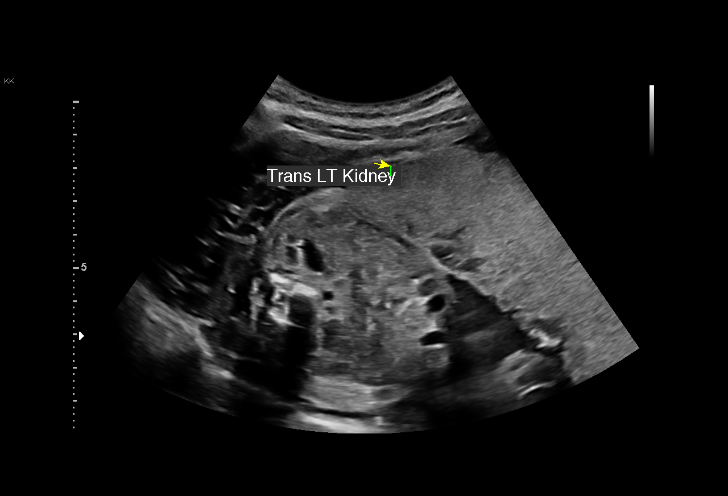
[im 84/120]
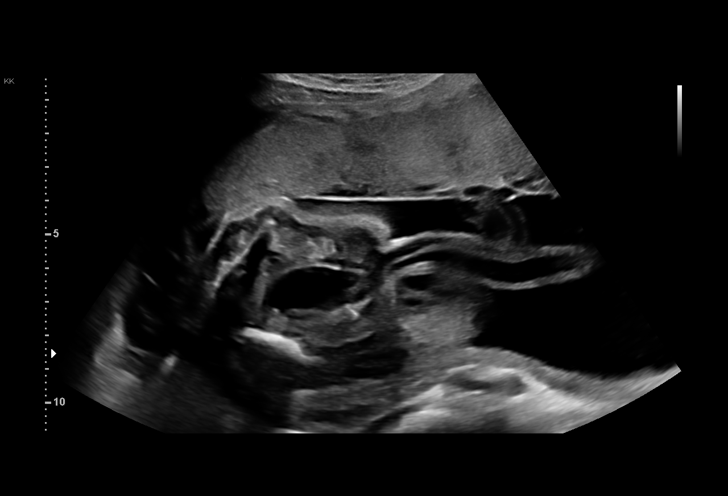
[im 93/120]
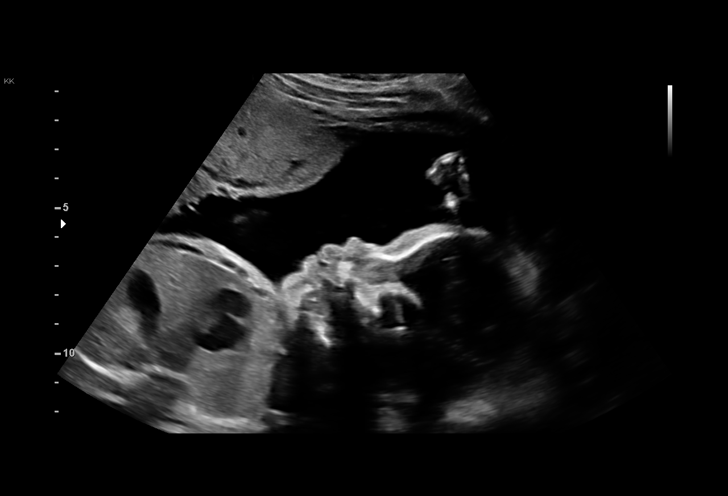
[im 102/120]
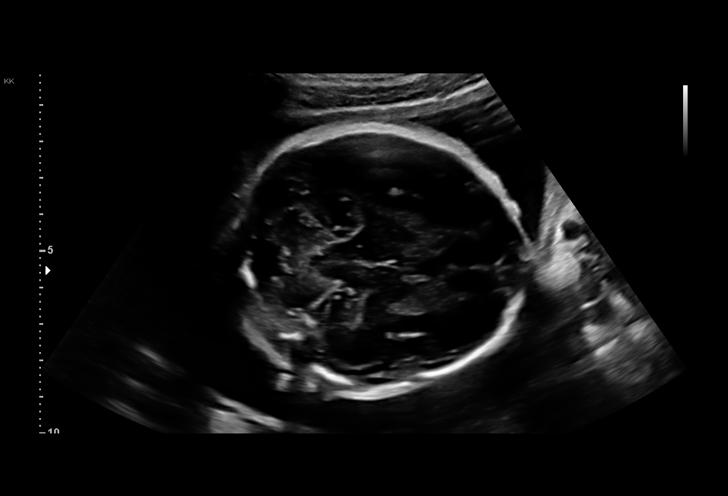
[im 111/120]
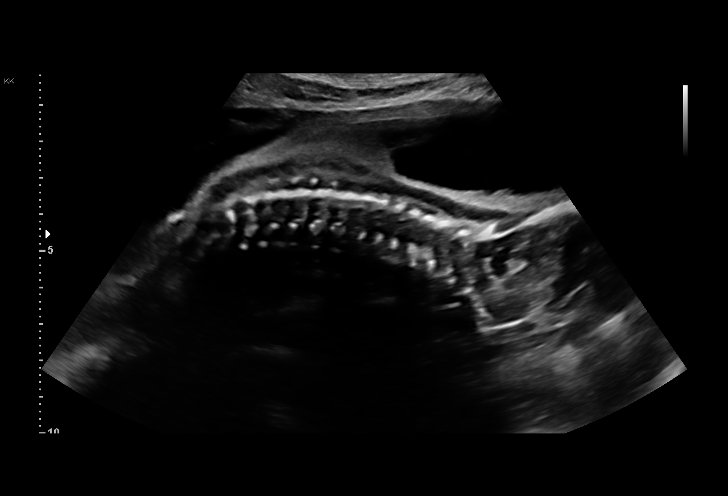
[im 120/120]
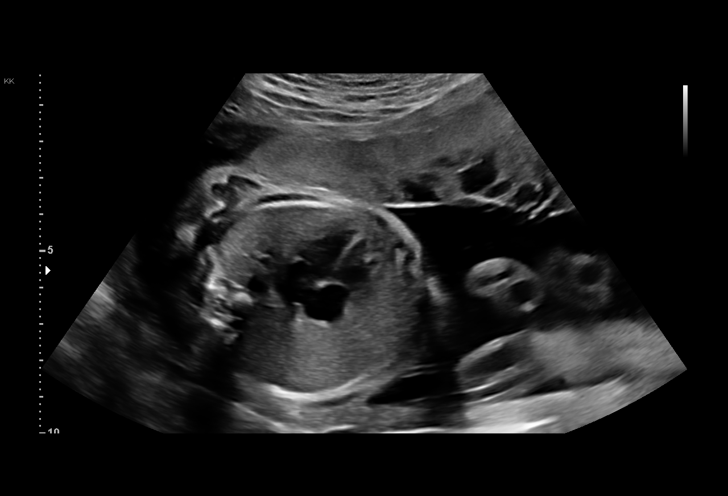

[14 of 28 positions shown; findings below may reference images not displayed]

JIM NP

1  TARCZA DEBERA           [id number]      [id number]     [id number]
Indications

25 weeks gestation of pregnancy
Encounter for antenatal screening for
malformations
Late prenatal care, second trimester
History of sickle cell trait
OB History

Blood Type:            Height:  5'4"   Weight (lb):  132       BMI:
Gravidity:    3         Term:   1        Prem:   0        SAB:   0
TOP:          1       Ectopic:  0        Living: 1
Fetal Evaluation

Num Of Fetuses:     1
Fetal Heart         154
Rate(bpm):
Cardiac Activity:   Observed
Presentation:       Cephalic
Placenta:           Anterior, above cervical os
P. Cord Insertion:  Visualized

Amniotic Fluid
AFI FV:      Subjectively within normal limits

Largest Pocket(cm)
5.2
Biometry

BPD:      71.7  mm     G. Age:  28w 5d       > 99  %    CI:        78.37   %    70 - 86
FL/HC:      19.8   %    18.6 -
HC:      256.2  mm     G. Age:  27w 6d         87  %    HC/AC:      1.19        1.04 -
AC:       216   mm     G. Age:  26w 1d         49  %    FL/BPD:     70.9   %    71 - 87
FL:       50.8  mm     G. Age:  27w 2d         76  %    FL/AC:      23.5   %    20 - 24
Est. FW:     987  gm      2 lb 3 oz     73  %
Gestational Age

LMP:           25w 6d        Date:  10/12/16                 EDD:   07/19/17
U/S Today:     27w 4d                                        EDD:   07/07/17
Best:          25w 6d     Det. By:  LMP  (10/12/16)          EDD:   07/19/17
Anatomy

Cranium:               Appears normal         Aortic Arch:            Appears normal
Cavum:                 Appears normal         Ductal Arch:            Appears normal
Ventricles:            Appears normal         Diaphragm:              Appears normal
Choroid Plexus:        Appears normal         Stomach:                Appears normal, left
sided
Cerebellum:            Appears normal         Abdomen:                Appears normal
Posterior Fossa:       Appears normal         Abdominal Wall:         Appears nml (cord
insert, abd wall)
Nuchal Fold:           Not applicable (>20    Cord Vessels:           Appears normal (3
wks GA)                                        vessel cord)
Face:                  Appears normal         Kidneys:                Appear normal
(orbits and profile)
Lips:                  Appears normal         Bladder:                Appears normal
Thoracic:              Appears normal         Spine:                  Limited views
appear normal
Heart:                 Appears normal         Upper Extremities:      Visualized
(4CH, axis, and situs
RVOT:                  Appears normal         Lower Extremities:      Visualized
LVOT:                  Appears normal

Other:  Male gender. Technically difficult due to fetal position.
Cervix Uterus Adnexa

Cervix
Length:            3.2  cm.
Normal appearance by transabdominal scan.

Adnexa:       No abnormality visualized.
Impression

Singleton intrauterine pregnancy at 25+6 weeks with  late
PNC here for anatomic survey
Review of the anatomy shows no sonographic markers for
aneuploidy or structural anomalies
All relevant fetal anatomy has been visualized
Amniotic fluid volume is normal
Estimated fetal weight shows growth in the 73rd percentile
Recommendations

Follow-up ultrasounds as clinically indicated.

## 2019-10-29 ENCOUNTER — Other Ambulatory Visit: Payer: Self-pay

## 2019-10-29 ENCOUNTER — Encounter (HOSPITAL_COMMUNITY): Payer: Self-pay

## 2019-10-29 DIAGNOSIS — Z0000 Encounter for general adult medical examination without abnormal findings: Secondary | ICD-10-CM | POA: Insufficient documentation

## 2019-10-29 NOTE — ED Triage Notes (Signed)
Patient states she has been having recurrent staph infections and wants antibiotics to get rid of it.

## 2019-10-30 ENCOUNTER — Emergency Department (HOSPITAL_COMMUNITY)
Admission: EM | Admit: 2019-10-30 | Discharge: 2019-10-30 | Disposition: A | Discharge status: Home / Self Care | Payer: Self-pay | Attending: Emergency Medicine | Admitting: Emergency Medicine

## 2019-10-30 DIAGNOSIS — Z0000 Encounter for general adult medical examination without abnormal findings: Secondary | ICD-10-CM

## 2019-10-30 NOTE — Discharge Instructions (Signed)
Thank you for allowing me to care for you today in the Emergency Department.   If you are not established with a primary care clinician, please call the number on your discharge paperwork to get established with primary care, especially if you are concerned about recurrent rashes or infections.  Your physical exam was reassuring today.  If you do get an area that is concerning for an infection, apply warm compress to the area for 15 to 20 minutes up to 3-4 times a day.  You should return to the emergency department if you develop a fever, temperature greater than 100.5, with an area of redness, swelling, thick, mucus-like drainage, red streaking, or other new, concerning symptoms.

## 2019-10-30 NOTE — ED Provider Notes (Signed)
[facility] DEPT Provider Note   CSN: [id number] Arrival date & time: 10/29/19  1741     History Chief Complaint  Patient presents with  . Recurrent Skin Infections    Melinda Burch is a 29 y.o. female with no significant past medical history who presents the emergency department with a chief complaint of "I need antibiotics for a staph infection."   The patient is concerned that she has a staph infection to the skin.  Reports that several weeks ago that she developed a bump near her right eye that became progressively swollen and caused her entire right eye to swell.  She reports that the swelling has since resolved and the bump has also since resolved.  States that she is concerned she will develop another area and needs a course of antibiotics.  She denies fevers, chills, visual changes, rashes, wounds, redness, warmth, or swelling.  She has no other complaints at this time.  No treatment prior to arrival.  The history is provided by the patient and medical records. No language interpreter was used.       Past Medical History:  Diagnosis Date  . Anemia   . Infection    UTI/ Trich    Patient Active Problem List   Diagnosis Date Noted  . Normal labor 07/15/2017  . UTI (urinary tract infection) due to Enterococcus 07/07/2017  . Trichomoniasis 07/07/2017  . Anemia affecting pregnancy in third trimester 05/30/2017  . Rubella non-immune status, antepartum 04/28/2017  . Supervision of other normal pregnancy, antepartum 04/05/2017  . Sickle cell trait ([place]) 04/05/2017  . ECZEMA 10/07/2007    Past Surgical History:  Procedure Laterality Date  . DILATION AND CURETTAGE OF UTERUS       OB History    Gravida  3   Para  2   Term  2   Preterm      AB  1   Living  2     SAB      TAB  1   Ectopic      Multiple  0   Live Births  2           History reviewed. No pertinent family history.  Social History   Tobacco Use  .  Smoking status: Never Smoker  . Smokeless tobacco: Never Used  Vaping Use  . Vaping Use: Never used  Substance Use Topics  . Alcohol use: No  . Drug use: No    Home Medications Prior to Admission medications   Medication Sig Start Date End Date Taking? Authorizing Provider  cephALEXin (KEFLEX) 500 MG capsule Take 1 capsule (500 mg total) by mouth 2 (two) times daily. 08/28/18   Montine Circle, PA-C  ibuprofen (ADVIL,MOTRIN) 600 MG tablet Take 1 tablet (600 mg total) by mouth every 6 (six) hours as needed. 07/16/17   Myrtis Ser, CNM  metroNIDAZOLE (FLAGYL) 500 MG tablet Take 1 tablet (500 mg total) by mouth 2 (two) times daily. 07/15/17   Cresenzo-Dishmon, Joaquim Lai, CNM    Allergies    Patient has no known allergies.  Review of Systems   Review of Systems  Constitutional: Negative for activity change, chills and fever.  Eyes: Negative for redness and visual disturbance.  Respiratory: Negative for shortness of breath.   Cardiovascular: Negative for chest pain.  Gastrointestinal: Negative for abdominal pain, diarrhea, nausea and vomiting.  Genitourinary: Negative for dysuria.  Musculoskeletal: Negative for back pain.  Skin: Negative for color change, rash and wound.  Allergic/Immunologic: Negative for immunocompromised state.  Neurological: Negative for headaches.  Psychiatric/Behavioral: Negative for confusion.    Physical Exam Updated Vital Signs BP 132/64   Pulse 88   Temp 98.4 F (36.9 C) (Oral)   Resp 18   Ht 5\' 4"  (1.626 m)   Wt 63.5 kg   LMP 10/27/2019   SpO2 97%   BMI 24.03 kg/m   Physical Exam Vitals and nursing note reviewed.  Constitutional:      General: She is not in acute distress.    Comments: Well-appearing.  No acute distress.  HENT:     Head: Normocephalic.  Eyes:     General: No scleral icterus.       Right eye: No discharge.     Extraocular Movements: Extraocular movements intact.     Conjunctiva/sclera: Conjunctivae normal.     Pupils:  Pupils are equal, round, and reactive to light.  Cardiovascular:     Rate and Rhythm: Normal rate and regular rhythm.     Heart sounds: No murmur heard.  No friction rub. No gallop.   Pulmonary:     Effort: Pulmonary effort is normal. No respiratory distress.     Breath sounds: No stridor. No wheezing, rhonchi or rales.  Chest:     Chest wall: No tenderness.  Abdominal:     General: There is no distension.     Palpations: Abdomen is soft. There is no mass.     Tenderness: There is no abdominal tenderness. There is no right CVA tenderness, left CVA tenderness, guarding or rebound.     Hernia: No hernia is present.  Musculoskeletal:     Cervical back: Neck supple.  Skin:    General: Skin is warm.     Findings: No rash.     Comments: No rashes, abscess, erythema, edema, warmth, or induration.  Neurological:     Mental Status: She is alert.  Psychiatric:        Behavior: Behavior normal.     ED Results / Procedures / Treatments   Labs (all labs ordered are listed, but only abnormal results are displayed) Labs Reviewed - No data to display  EKG None  Radiology No results found.  Procedures Procedures (including critical care time)  Medications Ordered in ED Medications - No data to display  ED Course  I have reviewed the triage vital signs and the nursing notes.  Pertinent labs & imaging results that were available during my care of the patient were reviewed by me and considered in my medical decision making (see chart for details).    MDM Rules/Calculators/A&P                          29 year old female with no significant past medical history presenting to the ER because she is concerned that she may have a staph infection and needs antibiotics.  Reports that she had a lesion on her face near her right eye several weeks ago because swelling to the right eye that has since resolved.  She has no rashes or wounds at this time.  No constitutional symptoms.  Discussed  that antibiotics are not appropriate at this time.  I do not see any evidence of SJS, TEN, TTS, syphilis, tickborne illness, scalded skin syndrome, HSV, contact dermatitis.  Physical exam is reassuring.  Recommended that the patient get established with primary care for continued follow-up.  Discussed good antibiotic stewardship with the patient.  All questions answered.  She is hemodynamically stable and in no acute distress.  Safe for discharge home with outpatient follow-up as indicated.   Final Clinical Impression(s) / ED Diagnoses Final diagnoses:  Wellness examination    Rx / DC Orders ED Discharge Orders    None       Joanne Gavel, PA-C 10/30/19 3014    Merryl Hacker, MD 10/31/19 [phone]

## 2021-01-25 ENCOUNTER — Ambulatory Visit (HOSPITAL_COMMUNITY)
Admission: EM | Admit: 2021-01-25 | Discharge: 2021-01-25 | Disposition: A | Discharge status: Home / Self Care | Payer: Medicaid Other | Attending: Emergency Medicine | Admitting: Emergency Medicine

## 2021-01-25 ENCOUNTER — Other Ambulatory Visit: Payer: Self-pay

## 2021-01-25 ENCOUNTER — Encounter (HOSPITAL_COMMUNITY): Payer: Self-pay | Admitting: *Deleted

## 2021-01-25 DIAGNOSIS — M5090 Cervical disc disorder, unspecified, unspecified cervical region: Secondary | ICD-10-CM | POA: Diagnosis not present

## 2021-01-25 DIAGNOSIS — M545 Low back pain, unspecified: Secondary | ICD-10-CM | POA: Diagnosis not present

## 2021-01-25 LAB — POCT URINALYSIS DIPSTICK, ED / UC
Bilirubin Urine: NEGATIVE
Glucose, UA: NEGATIVE mg/dL
Hgb urine dipstick: NEGATIVE
Ketones, ur: NEGATIVE mg/dL
Leukocytes,Ua: NEGATIVE
Nitrite: NEGATIVE
Protein, ur: NEGATIVE mg/dL
Specific Gravity, Urine: 1.02 (ref 1.005–1.030)
Urobilinogen, UA: 1 mg/dL (ref 0.0–1.0)
pH: 7.5 (ref 5.0–8.0)

## 2021-01-25 MED ORDER — CYCLOBENZAPRINE HCL 5 MG PO TABS: 5.0000 mg | ORAL_TABLET | Freq: Every day | ORAL | 0 refills | Status: DC

## 2021-01-25 MED ORDER — ACETAMINOPHEN 500 MG PO TABS: 500.0000 mg | ORAL_TABLET | Freq: Four times a day (QID) | ORAL | 0 refills | Status: DC | PRN

## 2021-01-25 NOTE — Discharge Instructions (Addendum)
Rest, ice and heat as needed Ensure adequate ROM as tolerated. Prescribed Tylenol 500 mg for pain relief Prescribed flexeril  for muscle spasm.  Do not drive or operate heavy machinery while taking this medication Return here or go to ER if you have any new or worsening symptoms such as numbness/tingling of the inner thighs, loss of bladder or bowel control, headache/blurry vision, nausea/vomiting, confusion/altered mental status, dizziness, weakness, passing out, imbalance, etc..Marland Kitchen

## 2021-01-25 NOTE — ED Provider Notes (Signed)
[place]   [id number] 01/25/21 Arrival Time: 0737   Chief Complaint  Patient presents with   Back Pain   Neck Pain     SUBJECTIVE: History from: patient.  Melinda Burch is a 30 y.o. female who presented to the urgent care for complaint of back pain for the past 2 to 3 days.  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  She localizes the pain to the upper and lower back.  She describes the pain as constant and achy.  She has tried OTC medications without relief.  Her symptoms are made worse with ROM.  She denies similar symptoms in the past.  Denies chills, fever, nausea, vomiting and diarrhea.   ROS: As per HPI.  All other pertinent ROS negative.     Past Medical History:  Diagnosis Date   Anemia    Infection    UTI/ Trich   Past Surgical History:  Procedure Laterality Date   DILATION AND CURETTAGE OF UTERUS     No Known Allergies No current facility-administered medications on file prior to encounter.   Current Outpatient Medications on File Prior to Encounter  Medication Sig Dispense Refill   cephALEXin (KEFLEX) 500 MG capsule Take 1 capsule (500 mg total) by mouth 2 (two) times daily. 14 capsule 0   ibuprofen (ADVIL,MOTRIN) 600 MG tablet Take 1 tablet (600 mg total) by mouth every 6 (six) hours as needed. 30 tablet 0   metroNIDAZOLE (FLAGYL) 500 MG tablet Take 1 tablet (500 mg total) by mouth 2 (two) times daily. 14 tablet 0   Social History   Socioeconomic History   Marital status: Single    Spouse name: Not on file   Number of children: Not on file   Years of education: Not on file   Highest education level: Not on file  Occupational History   Not on file  Tobacco Use   Smoking status: Never   Smokeless tobacco: Never  Vaping Use   Vaping Use: Never used  Substance and Sexual Activity   Alcohol use: No   Drug use: No   Sexual activity: Yes  Other Topics Concern   Not on file  Social  History Narrative   Not on file   Social Determinants of Health   Financial Resource Strain: Not on file  Food Insecurity: Not on file  Transportation Needs: Not on file  Physical Activity: Not on file  Stress: Not on file  Social Connections: Not on file  Intimate Partner Violence: Not on file   History reviewed. No pertinent family history.  OBJECTIVE:  Vitals:   01/25/21 1544  BP: 115/72  Pulse: 95  Resp: 18  Temp: 98.8 F (37.1 C)  SpO2: 100%     Physical Exam Vitals and nursing note reviewed.  Constitutional:      General: She is not in acute distress.    Appearance: Normal appearance. She is normal weight. She is not ill-appearing, toxic-appearing or diaphoretic.  HENT:     Head: Normocephalic.  Cardiovascular:     Rate and Rhythm: Normal rate and regular rhythm.     Pulses: Normal pulses.     Heart sounds: Normal heart sounds. No murmur heard.   No friction rub. No gallop.  Pulmonary:     Effort: Pulmonary effort is normal. No respiratory distress.     Breath sounds: Normal breath sounds. No stridor. No wheezing, rhonchi or rales.  Chest:  Chest wall: No tenderness.  Musculoskeletal:     Cervical back: Tenderness present.     Thoracic back: Normal.     Lumbar back: Tenderness present.  Neurological:     Mental Status: She is alert and oriented to person, place, and time.     LABS:  Results for orders placed or performed during the hospital encounter of 01/25/21 (from the past 24 hour(s))  POC Urinalysis dipstick     Status: None   Collection Time: 01/25/21  4:39 PM  Result Value Ref Range   Glucose, UA NEGATIVE NEGATIVE mg/dL   Bilirubin Urine NEGATIVE NEGATIVE   Ketones, ur NEGATIVE NEGATIVE mg/dL   Specific Gravity, Urine 1.020 1.005 - 1.030   Hgb urine dipstick NEGATIVE NEGATIVE   pH 7.5 5.0 - 8.0   Protein, ur NEGATIVE NEGATIVE mg/dL   Urobilinogen, UA 1.0 0.0 - 1.0 mg/dL   Nitrite NEGATIVE NEGATIVE   Leukocytes,Ua NEGATIVE NEGATIVE      ASSESSMENT & PLAN:   Acute bilateral low back pain without sciatica  Cervical back pain with evidence of disc disease   Meds ordered this encounter  Medications   cyclobenzaprine (FLEXERIL) 5 MG tablet    Sig: Take 1 tablet (5 mg total) by mouth at bedtime.    Dispense:  30 tablet    Refill:  0   acetaminophen (TYLENOL) 500 MG tablet    Sig: Take 1 tablet (500 mg total) by mouth every 6 (six) hours as needed.    Dispense:  30 tablet    Refill:  0     Discharge instructions  Rest, ice and heat as needed Ensure adequate ROM as tolerated. Prescribed Tylenol 500 mg for pain relief Prescribed flexeril  for muscle spasm.  Do not drive or operate heavy machinery while taking this medication Return here or go to ER if you have any new or worsening symptoms such as numbness/tingling of the inner thighs, loss of bladder or bowel control, headache/blurry vision, nausea/vomiting, confusion/altered mental status, dizziness, weakness, passing out, imbalance, etc...    Reviewed expectations re: course of current medical issues. Questions answered. Outlined signs and symptoms indicating need for more acute intervention. Patient verbalized understanding. After Visit Summary given.          Emerson Monte, [place] 01/25/21 1656

## 2021-01-25 NOTE — ED Triage Notes (Signed)
Pt reports back pain and neck pain since Friday with out injury. 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

## 2021-01-26 LAB — URINE CULTURE: Culture: NO GROWTH

## 2022-02-01 ENCOUNTER — Ambulatory Visit
Admission: EM | Admit: 2022-02-01 | Discharge: 2022-02-01 | Disposition: A | Discharge status: Home / Self Care | Payer: Medicaid Other

## 2022-02-01 DIAGNOSIS — B349 Viral infection, unspecified: Secondary | ICD-10-CM

## 2022-02-01 NOTE — ED Triage Notes (Signed)
Pt c/o cough, headache (resolved), body aches  Onset ~ thurs   Denies pain in triage

## 2022-02-01 NOTE — Discharge Instructions (Addendum)
Your symptoms and exam are consistent for a viral illness. Please treat your symptoms with over the counter cough medication, tylenol or ibuprofen, humidifier, and rest. Viral illnesses can last 7-14 days. Please follow up with your PCP if your symptoms are not improving. Please go to the ER for any worsening symptoms. This includes but is not limited to fever you can not control with tylenol or ibuprofen, you are not able to stay hydrated, you have shortness of breath or chest pain.  Thank you for choosing  for your healthcare needs. I hope you feel better soon!  

## 2022-02-01 NOTE — ED Provider Notes (Signed)
[name]    CSN: [id number] Arrival date & time: 02/01/22  1338      History   Chief Complaint Chief Complaint  Patient presents with   Cough    HPI Melinda Burch is a 31 y.o. female who presents for evaluation of URI symptoms for 5 days. Patient reports associated symptoms of cough, congestion, body aches, headaches. Denies N/V/D, sore throat, fevers, shortness of breath. Patient does not have a hx of asthma or smoking.  Son has similar symptoms.  No recent travel. Pt is not vaccinated for COVID. Pt is not vaccinated for flu this season. Pt has taken tylenol OTC for symptoms. Pt has no other concerns at this time.     Cough Associated symptoms: headaches and myalgias     Past Medical History:  Diagnosis Date   Anemia    Infection    UTI/ Trich    Patient Active Problem List   Diagnosis Date Noted   Normal labor 07/15/2017   UTI (urinary tract infection) due to Enterococcus 07/07/2017   Trichomoniasis 07/07/2017   Anemia affecting pregnancy in third trimester 05/30/2017   Rubella non-immune status, antepartum 04/28/2017   Supervision of other normal pregnancy, antepartum 04/05/2017   Sickle cell trait ([place]) 04/05/2017   ECZEMA 10/07/2007    Past Surgical History:  Procedure Laterality Date   DILATION AND CURETTAGE OF UTERUS      OB History     Gravida  3   Para  2   Term  2   Preterm      AB  1   Living  2      SAB      IAB  1   Ectopic      Multiple  0   Live Births  2            Home Medications    Prior to Admission medications   Medication Sig Start Date End Date Taking? Authorizing Provider  acetaminophen (TYLENOL) 500 MG tablet Take 1 tablet (500 mg total) by mouth every 6 (six) hours as needed. 01/25/21   Avegno, Darrelyn Hillock, FNP  cephALEXin (KEFLEX) 500 MG capsule Take 1 capsule (500 mg total) by mouth 2 (two) times daily. 08/28/18   Montine Circle, PA-C  cyclobenzaprine (FLEXERIL) 5 MG tablet Take 1  tablet (5 mg total) by mouth at bedtime. 01/25/21   Avegno, Darrelyn Hillock, FNP  ibuprofen (ADVIL,MOTRIN) 600 MG tablet Take 1 tablet (600 mg total) by mouth every 6 (six) hours as needed. 07/16/17   Myrtis Ser, CNM  metroNIDAZOLE (FLAGYL) 500 MG tablet Take 1 tablet (500 mg total) by mouth 2 (two) times daily. 07/15/17   Christin Fudge, [place]    Family History History reviewed. No pertinent family history.  Social History Social History   Tobacco Use   Smoking status: Never   Smokeless tobacco: Never  Vaping Use   Vaping Use: Never used  Substance Use Topics   Alcohol use: No   Drug use: No     Allergies   Patient has no known allergies.   Review of Systems Review of Systems  HENT:  Positive for congestion.   Respiratory:  Positive for cough.   Musculoskeletal:  Positive for myalgias.  Neurological:  Positive for headaches.     Physical Exam Triage Vital Signs ED Triage Vitals  Enc Vitals Group     BP 02/01/22 1401 90/70     Pulse Rate 02/01/22 1401 100  Resp 02/01/22 1401 18     Temp 02/01/22 1401 99 F (37.2 C)     Temp Source 02/01/22 1401 Oral     SpO2 02/01/22 1401 97 %     Weight --      Height --      Head Circumference --      Peak Flow --      Pain Score 02/01/22 1400 0     Pain Loc --      Pain Edu? --      Excl. in [place]? --    No data found.  Updated Vital Signs BP 90/70 (BP Location: Right Arm)   Pulse 100   Temp 99 F (37.2 C) (Oral)   Resp 18   SpO2 97%   Breastfeeding No   Visual Acuity Right Eye Distance:   Left Eye Distance:   Bilateral Distance:    Right Eye Near:   Left Eye Near:    Bilateral Near:     Physical Exam Vitals and nursing note reviewed.  Constitutional:      General: She is not in acute distress.    Appearance: She is well-developed. She is not ill-appearing.  HENT:     Head: Normocephalic and atraumatic.     Right Ear: Tympanic membrane and ear canal normal.     Left Ear: Tympanic membrane  and ear canal normal.     Nose: Congestion present.     Mouth/Throat:     Mouth: Mucous membranes are moist.     Pharynx: Oropharynx is clear. Uvula midline. Posterior oropharyngeal erythema present.     Tonsils: No tonsillar exudate or tonsillar abscesses.  Eyes:     Conjunctiva/sclera: Conjunctivae normal.     Pupils: Pupils are equal, round, and reactive to light.  Cardiovascular:     Rate and Rhythm: Normal rate and regular rhythm.     Heart sounds: Normal heart sounds.  Pulmonary:     Effort: Pulmonary effort is normal.     Breath sounds: Normal breath sounds.  Musculoskeletal:     Cervical back: Normal range of motion and neck supple.  Lymphadenopathy:     Cervical: No cervical adenopathy.  Skin:    General: Skin is warm and dry.  Neurological:     General: No focal deficit present.     Mental Status: She is alert and oriented to person, place, and time.  Psychiatric:        Mood and Affect: Mood normal.        Behavior: Behavior normal.      UC Treatments / Results  Labs (all labs ordered are listed, but only abnormal results are displayed) Labs Reviewed - No data to display  EKG   Radiology No results found.  Procedures Procedures (including critical care time)  Medications Ordered in UC Medications - No data to display  Initial Impression / Assessment and Plan / UC Course  I have reviewed the triage vital signs and the nursing notes.  Pertinent labs & imaging results that were available during my care of the patient were reviewed by me and considered in my medical decision making (see chart for details).     Discussed with patient viral illness and symptomatic treatment  Rest and fluids OTC cough medication as needed Follow up with PCP in 2-3 days for re-check  ER precautions reviewed and pt verbalized understanding  Final Clinical Impressions(s) / UC Diagnoses   Final diagnoses:  Viral illness  Discharge Instructions      Your  symptoms and exam are consistent for a viral illness. Please treat your symptoms with over the counter cough medication, tylenol or ibuprofen, humidifier, and rest. Viral illnesses can last 7-14 days. Please follow up with your PCP if your symptoms are not improving. Please go to the ER for any worsening symptoms. This includes but is not limited to fever you can not control with tylenol or ibuprofen, you are not able to stay hydrated, you have shortness of breath or chest pain.  Thank you for choosing [facility] for your healthcare needs. I hope you feel better soon!      ED Prescriptions   None    PDMP not reviewed this encounter.   Melynda Ripple, NP 02/01/22 [phone]

## 2022-02-02 ENCOUNTER — Telehealth (HOSPITAL_COMMUNITY): Payer: Self-pay | Admitting: Emergency Medicine

## 2022-02-02 MED ORDER — AMOXICILLIN 875 MG PO TABS: 875.0000 mg | ORAL_TABLET | Freq: Two times a day (BID) | ORAL | 0 refills | Status: AC

## 2022-09-21 ENCOUNTER — Emergency Department (HOSPITAL_COMMUNITY)
Admission: EM | Admit: 2022-09-21 | Discharge: 2022-09-22 | Disposition: A | Discharge status: Home / Self Care | Payer: Medicaid Other | Attending: Emergency Medicine | Admitting: Emergency Medicine

## 2022-09-21 ENCOUNTER — Other Ambulatory Visit: Payer: Self-pay

## 2022-09-21 ENCOUNTER — Encounter (HOSPITAL_COMMUNITY): Payer: Self-pay

## 2022-09-21 DIAGNOSIS — R519 Headache, unspecified: Secondary | ICD-10-CM | POA: Diagnosis not present

## 2022-09-21 DIAGNOSIS — E876 Hypokalemia: Secondary | ICD-10-CM | POA: Diagnosis not present

## 2022-09-21 DIAGNOSIS — R0981 Nasal congestion: Secondary | ICD-10-CM

## 2022-09-21 DIAGNOSIS — Z3A10 10 weeks gestation of pregnancy: Secondary | ICD-10-CM | POA: Insufficient documentation

## 2022-09-21 DIAGNOSIS — Z1152 Encounter for screening for COVID-19: Secondary | ICD-10-CM | POA: Insufficient documentation

## 2022-09-21 DIAGNOSIS — O219 Vomiting of pregnancy, unspecified: Secondary | ICD-10-CM | POA: Diagnosis not present

## 2022-09-21 DIAGNOSIS — R110 Nausea: Secondary | ICD-10-CM | POA: Insufficient documentation

## 2022-09-21 DIAGNOSIS — R5383 Other fatigue: Secondary | ICD-10-CM

## 2022-09-21 DIAGNOSIS — J069 Acute upper respiratory infection, unspecified: Secondary | ICD-10-CM | POA: Insufficient documentation

## 2022-09-21 LAB — COMPREHENSIVE METABOLIC PANEL
ALT: 13 U/L (ref 0–44)
AST: 16 U/L (ref 15–41)
Albumin: 4 g/dL (ref 3.5–5.0)
Alkaline Phosphatase: 25 U/L — ABNORMAL LOW (ref 38–126)
Anion gap: 8 (ref 5–15)
BUN: 7 mg/dL (ref 6–20)
CO2: 24 mmol/L (ref 22–32)
Calcium: 8.8 mg/dL — ABNORMAL LOW (ref 8.9–10.3)
Chloride: 104 mmol/L (ref 98–111)
Creatinine, Ser: 0.59 mg/dL (ref 0.44–1.00)
GFR, Estimated: >60 mL/min (ref >60)
Glucose, Bld: 102 mg/dL — ABNORMAL HIGH (ref 70–99)
Potassium: 3.4 mmol/L — ABNORMAL LOW (ref 3.5–5.1)
Sodium: 136 mmol/L (ref 135–145)
Total Bilirubin: 0.6 mg/dL (ref 0.3–1.2)
Total Protein: 7.5 g/dL (ref 6.5–8.1)

## 2022-09-21 LAB — CBC
HCT: 33.2 % — ABNORMAL LOW (ref 36.0–46.0)
Hemoglobin: 11.2 g/dL — ABNORMAL LOW (ref 12.0–15.0)
MCH: 28.7 pg (ref 26.0–34.0)
MCHC: 33.7 g/dL (ref 30.0–36.0)
MCV: 85.1 fL (ref 80.0–100.0)
Platelets: 275 10*3/uL (ref 150–400)
RBC: 3.9 MIL/uL (ref 3.87–5.11)
RDW: 14.4 % (ref 11.5–15.5)
WBC: 6.3 10*3/uL (ref 4.0–10.5)
nRBC: 0 % (ref 0.0–0.2)

## 2022-09-21 LAB — URINALYSIS, ROUTINE W REFLEX MICROSCOPIC
Bilirubin Urine: NEGATIVE
Glucose, UA: NEGATIVE mg/dL
Ketones, ur: 5 mg/dL — AB
Leukocytes,Ua: NEGATIVE
Nitrite: NEGATIVE
Protein, ur: NEGATIVE mg/dL
Specific Gravity, Urine: 1.015 (ref 1.005–1.030)
pH: 5 (ref 5.0–8.0)

## 2022-09-21 LAB — HCG, SERUM, QUALITATIVE: Preg, Serum: POSITIVE — AB

## 2022-09-21 LAB — RESP PANEL BY RT-PCR (RSV, FLU A&B, COVID)  RVPGX2
Influenza A by PCR: NEGATIVE
Influenza B by PCR: NEGATIVE
Resp Syncytial Virus by PCR: NEGATIVE
SARS Coronavirus 2 by RT PCR: NEGATIVE

## 2022-09-21 LAB — LIPASE, BLOOD: Lipase: 35 U/L (ref 11–51)

## 2022-09-21 MED ORDER — ONDANSETRON 4 MG PO TBDP: 4.0000 mg | ORAL_TABLET | Freq: Three times a day (TID) | ORAL | 0 refills | Status: DC | PRN

## 2022-09-21 MED ORDER — SODIUM CHLORIDE 0.9 % IV BOLUS
1000.0000 mL | Freq: Once | INTRAVENOUS | Status: AC
  Administered 2022-09-21: 1000 mL via INTRAVENOUS

## 2022-09-21 MED ORDER — POTASSIUM CHLORIDE CRYS ER 20 MEQ PO TBCR
20.0000 meq | EXTENDED_RELEASE_TABLET | Freq: Once | ORAL | Status: AC
Start: 2022-09-22 — End: 2022-09-22
  Administered 2022-09-22: 20 meq via ORAL
  Filled 2022-09-21: qty 1

## 2022-09-21 MED ORDER — ONDANSETRON HCL 4 MG/2ML IJ SOLN
4.0000 mg | Freq: Once | INTRAMUSCULAR | Status: AC
  Administered 2022-09-21: 4 mg via INTRAVENOUS
  Filled 2022-09-21: qty 2

## 2022-09-21 NOTE — ED Provider Notes (Signed)
[place] [facility] Provider Note   CSN: [id number] Arrival date & time: 09/21/22  2042     History  Chief Complaint  Patient presents with   Emesis During Pregnancy    Melinda Burch is a 32 y.o. female.  The history is provided by the patient and medical records. No language interpreter was used.  Emesis Severity:  Severe Duration:  2 weeks Timing:  Constant Quality:  Unable to specify Progression:  Unchanged Chronicity:  Recurrent Recent urination:  Decreased (darker uyriuen) Relieved by:  Nothing Worsened by:  Nothing Ineffective treatments:  Antiemetics Associated symptoms: headaches and URI (congestion)   Associated symptoms: no abdominal pain, no chills, no cough, no diarrhea and no fever   Risk factors: pregnant        Home Medications Prior to Admission medications   Medication Sig Start Date End Date Taking? Authorizing Provider  acetaminophen (TYLENOL) 500 MG tablet Take 1 tablet (500 mg total) by mouth every 6 (six) hours as needed. 01/25/21   Avegno, Zachery Dakins, FNP  cephALEXin (KEFLEX) 500 MG capsule Take 1 capsule (500 mg total) by mouth 2 (two) times daily. 08/28/18   Roxy Horseman, PA-C  cyclobenzaprine (FLEXERIL) 5 MG tablet Take 1 tablet (5 mg total) by mouth at bedtime. 01/25/21   Avegno, Zachery Dakins, FNP  ibuprofen (ADVIL,MOTRIN) 600 MG tablet Take 1 tablet (600 mg total) by mouth every 6 (six) hours as needed. 07/16/17   Arabella Merles, CNM  metroNIDAZOLE (FLAGYL) 500 MG tablet Take 1 tablet (500 mg total) by mouth 2 (two) times daily. 07/15/17   Cresenzo-Dishmon, Scarlette Calico, CNM      Allergies    Patient has no known allergies.    Review of Systems   Review of Systems  Constitutional:  Positive for fatigue. Negative for chills, diaphoresis and fever.  HENT:  Positive for congestion.   Eyes:  Negative for visual disturbance.  Respiratory:  Negative for cough, chest tightness and shortness of breath.    Cardiovascular:  Negative for chest pain, palpitations and leg swelling.  Gastrointestinal:  Positive for nausea and vomiting. Negative for abdominal pain, constipation and diarrhea.  Genitourinary:  Negative for difficulty urinating and flank pain.       Darkened urine  Musculoskeletal:  Negative for back pain, neck pain and neck stiffness.  Skin:  Negative for rash and wound.  Neurological:  Positive for headaches. Negative for syncope, weakness, light-headedness and numbness.  Psychiatric/Behavioral:  Negative for agitation and confusion.   All other systems reviewed and are negative.   Physical Exam Updated Vital Signs BP 126/87   Pulse 93   Temp 98.5 F (36.9 C) (Oral)   Resp 19   LMP 07/16/2022 (Exact Date)   SpO2 100%  Physical Exam Vitals and nursing note reviewed.  Constitutional:      General: She is not in acute distress.    Appearance: She is well-developed. She is not ill-appearing, toxic-appearing or diaphoretic.  HENT:     Head: Normocephalic and atraumatic.     Nose: No congestion or rhinorrhea.     Mouth/Throat:     Mouth: Mucous membranes are dry.     Pharynx: No oropharyngeal exudate or posterior oropharyngeal erythema.  Eyes:     Extraocular Movements: Extraocular movements intact.     Conjunctiva/sclera: Conjunctivae normal.     Pupils: Pupils are equal, round, and reactive to light.  Cardiovascular:     Rate and Rhythm: Normal rate and regular  rhythm.     Pulses: Normal pulses.     Heart sounds: No murmur heard. Pulmonary:     Effort: Pulmonary effort is normal. No respiratory distress.     Breath sounds: Normal breath sounds. No wheezing, rhonchi or rales.  Chest:     Chest wall: No tenderness.  Abdominal:     General: Abdomen is flat.     Palpations: Abdomen is soft.     Tenderness: There is no abdominal tenderness. There is no right CVA tenderness, left CVA tenderness, guarding or rebound.  Musculoskeletal:        General: No swelling.      Cervical back: Neck supple. No tenderness.     Right lower leg: No edema.     Left lower leg: No edema.  Skin:    General: Skin is warm and dry.     Capillary Refill: Capillary refill takes less than 2 seconds.     Findings: No erythema or rash.  Neurological:     General: No focal deficit present.     Mental Status: She is alert.     Sensory: No sensory deficit.     Motor: No weakness.     Gait: Gait normal.  Psychiatric:        Mood and Affect: Mood normal.     ED Results / Procedures / Treatments   Labs (all labs ordered are listed, but only abnormal results are displayed) Labs Reviewed  COMPREHENSIVE METABOLIC PANEL - Abnormal; Notable for the following components:      Result Value   Potassium 3.4 (*)    Glucose, Bld 102 (*)    Calcium 8.8 (*)    Alkaline Phosphatase 25 (*)    All other components within normal limits  CBC - Abnormal; Notable for the following components:   Hemoglobin 11.2 (*)    HCT 33.2 (*)    All other components within normal limits  URINALYSIS, ROUTINE W REFLEX MICROSCOPIC - Abnormal; Notable for the following components:   APPearance HAZY (*)    Hgb urine dipstick SMALL (*)    Ketones, ur 5 (*)    Bacteria, UA MANY (*)    All other components within normal limits  HCG, SERUM, QUALITATIVE - Abnormal; Notable for the following components:   Preg, Serum POSITIVE (*)    All other components within normal limits  RESP PANEL BY RT-PCR (RSV, FLU A&B, COVID)  [id number]  URINE CULTURE  LIPASE, BLOOD    EKG None  Radiology No results found.  Procedures Procedures    Medications Ordered in ED Medications  potassium chloride SA (KLOR-CON M) CR tablet 20 mEq (has no administration in time range)  sodium chloride 0.9 % bolus 1,000 mL (1,000 mLs Intravenous New Bag/Given 09/21/22 2219)  ondansetron (ZOFRAN) injection 4 mg (4 mg Intravenous Given 09/21/22 2213)    ED Course/ Medical Decision Making/ A&P                                 Medical  Decision Making Amount and/or Complexity of Data Reviewed Labs: ordered.  Risk Prescription drug management.     Melinda Burch is a 32 y.o. female with a past medical history significant for early pregnancy at 9 weeks and 4 days who presents with nausea vomiting, fatigue, mild headache, and darkened urine.  According to patient, every pregnancy she has had she has had severe nausea and vomiting in the  last week or so has had worsened nausea and vomiting.  She reports she has tried vitamin B6 and another vitamin supplementations to help with the nausea.  She reports that it has not been helping and she is getting dehydrated.  She reports she is not much to drink in the last week and has felt fatigued.  She denies any significant chest pain or abdominal pain.  Denies vaginal bleeding or vaginal discharge.  Denies dysuria but does report her urine has been darker.  Denies diarrhea but does report some mild constipation with the pregnancy.  Reports some headaches congestion and fatigue but no fevers or chills.  No sick contacts to her knowledge.  Presents because she has continued nausea and vomiting that is not responding to home medications.  On exam, lungs clear.  Chest nontender.  Abdomen nontender.  Dry mucous membranes but otherwise no focal neurologic deficits.  Normal gait.  Skin well-appearing.  We had a shared decision-making conversation and agreed to get some screening labs and give her some IV fluids and nausea medicine.  She reports that Zofran has helped her in the past as she has failed some of the outpatient medicines.  We will give her IV Zofran and fluids.  Will check for COVID given the congestion headache, and fatigue.  Given her lack of productive cough chest pain or shortness of breath will hold on imaging.  Labs began to return.  Urinalysis does not show nitrites or leuks, doubt UTI.  Metabolic panel showed mild hypokalemia of 3.4 but otherwise normal kidney and liver function.   Lipase not elevated.  She is pregnant.  CBC shows no leukocytosis and only mild anemia.  Patient is awaiting a COVID test, if it is negative and patient is feeling better, dispo discharge home with Zofran for instructions to follow-up with her OB/GYN and PCP.           COVID test returned negative.  Patient is able to drink now and is feeling better.  Patient be discharged home with reassuring vital signs to follow-up with outpatient OB/GYN team.  Patient has tolerated Zofran for and would like this medication as she has failed some of the other medications such as B6.  She agreed with plan of care and otherwise her concerns and patient was discharged in good condition.        Final Clinical Impression(s) / ED Diagnoses Final diagnoses:  Nausea/vomiting in pregnancy  Nasal congestion  Fatigue, unspecified type  Hypokalemia    Rx / DC Orders ED Discharge Orders          Ordered    ondansetron (ZOFRAN-ODT) 4 MG disintegrating tablet  Every 8 hours PRN        09/21/22 2357           Clinical Impression: 1. Nausea/vomiting in pregnancy   2. Nasal congestion   3. Fatigue, unspecified type   4. Hypokalemia     Disposition: Discharge  Condition: Good  I have discussed the results, Dx and Tx plan with the pt(& family if present). He/she/they expressed understanding and agree(s) with the plan. Discharge instructions discussed at great length. Strict return precautions discussed and pt &/or family have verbalized understanding of the instructions. No further questions at time of discharge.    Current Discharge Medication List     START taking these medications   Details  ondansetron (ZOFRAN-ODT) 4 MG disintegrating tablet Take 1 tablet (4 mg total) by mouth every 8 (eight) hours as needed  for nausea or vomiting. Qty: 20 tablet, Refills: 0        Follow Up: your OBGYN        Donelle Baba, Canary Brim, MD 09/21/22 2359

## 2022-09-21 NOTE — ED Triage Notes (Signed)
[redacted] weeks pregnant, pt reports unable to keep anything down , all day, pt has an upcoming appt with ob. Pt reports 4th pregnancy and reports same with the prior

## 2022-09-21 NOTE — Discharge Instructions (Signed)
Your history, exam, and evaluation are consistent with some dehydration and mild electrolyte abnormality with hypokalemia or low potassium in the setting of all of the nausea and vomiting.  After fluids and medications you appeared to feel better.  Your COVID and flu and RSV test was negative and your urine did not show convincing evidence of infection at this time.  We feel you are safe for discharge home with improved symptoms with please rest and stay hydrated.  We had a shared decision-making conversation about nausea medicine and as you have had success with Zofran before and of already tried and failed with other outpatient medications, we will give you prescription for Zofran.  Please follow-up with your primary care physician and OB/GYN for further management.  If any symptoms change or worsen acutely, please return to the nearest emergency department.

## 2022-09-22 ENCOUNTER — Encounter: Payer: Self-pay | Admitting: *Deleted

## 2022-10-14 ENCOUNTER — Telehealth (INDEPENDENT_AMBULATORY_CARE_PROVIDER_SITE_OTHER): Payer: Medicaid Other

## 2022-10-14 DIAGNOSIS — Z3492 Encounter for supervision of normal pregnancy, unspecified, second trimester: Secondary | ICD-10-CM

## 2022-10-14 DIAGNOSIS — Z3480 Encounter for supervision of other normal pregnancy, unspecified trimester: Secondary | ICD-10-CM

## 2022-10-14 DIAGNOSIS — Z3490 Encounter for supervision of normal pregnancy, unspecified, unspecified trimester: Secondary | ICD-10-CM | POA: Insufficient documentation

## 2022-10-14 MED ORDER — BLOOD PRESSURE KIT DEVI: 1.0000 | 0 refills | Status: DC | PRN

## 2022-10-14 NOTE — Progress Notes (Signed)
New OB Intake  I connected with Melinda Burch  on 10/14/22 at  3:15 PM EDT by telephone  Visit and verified that I am speaking with the correct person using two identifiers. Nurse is located at [facility] and pt is located at home.  I discussed the limitations, risks, security and privacy concerns of performing an evaluation and management service by telephone and the availability of in person appointments. I also discussed with the patient that there may be a patient responsible charge related to this service. The patient expressed understanding and agreed to proceed.  I explained I am completing New OB Intake today. We discussed EDD of 04/22/2023, by Last Menstrual Period. Pt is [id number]. I reviewed her allergies, medications and Medical/Surgical/OB history.    There are no problems to display for this patient.   Concerns addressed today  Delivery Plans Plans to deliver at [facility]. Discussed the nature of our practice with multiple providers including residents and students. Due to the size of the practice, the delivering provider may not be the same as those providing prenatal care.   Offered upcoming OB visit with CNM to discuss further.  MyChart/Babyscripts MyChart access verified. I explained pt will have some visits in office and some virtually. Babyscripts instructions given and order placed. Patient verifies receipt of registration text/e-mail. Account successfully created and app downloaded.  Blood Pressure Cuff/Weight Scale Blood pressure cuff ordered for patient to pick-up from [employer]. Explained after first prenatal appt pt will check weekly and document in Babyscripts.  Anatomy US Explained first scheduled [place] will be around 19 weeks. Anatomy US scheduled for 11/26/2022 at 9:15am.  Is patient a CenteringPregnancy candidate?  Interested but haven't accepted   Is patient a Mom+Baby Combined Care candidate?  Not a candidate   If accepted, confirm patient does not intend  to move from the area for at least 12 months, then notify Mom+Baby staff  Interested in [place]? If yes, send referral and doula dot phrase.   Is patient a candidate for Babyscripts Optimization? Yes  First visit review I reviewed new OB appt with patient. Explained pt will be seen by Dr. Vergie Living at first visit. Discussed Avelina Laine genetic screening with patient.  Panorama and Horizon.. Routine prenatal labs is needed at new ob appointment.  Last Pap No results found for: "DIAGPAP"  Lowry Bowl, CMA 10/14/2022  4:04 PM

## 2022-10-14 NOTE — Patient Instructions (Signed)
Safe Medications in Pregnancy   Acne:  Benzoyl Peroxide  Salicylic Acid   Backache/Headache:  Tylenol: 2 regular strength every 4 hours OR               2 Extra strength every 6 hours   Colds/Coughs/Allergies:  Benadryl (alcohol free) 25 mg every 6 hours as needed  Breath right strips  Claritin  Cepacol throat lozenges  Chloraseptic throat spray  Cold-Eeze- up to three times per day  Cough drops, alcohol free  Flonase (by prescription only)  Guaifenesin  Mucinex  Robitussin DM (plain only, alcohol free)  Saline nasal spray/drops  Sudafed (pseudoephedrine) & Actifed * use only after [redacted] weeks gestation and if you do not have high blood pressure  Tylenol  Vicks Vaporub  Zinc lozenges  Zyrtec   Constipation:  Colace  Ducolax suppositories  Fleet enema  Glycerin suppositories  Metamucil  Milk of magnesia  Miralax  Senokot  Smooth move tea   Diarrhea:  Kaopectate  Imodium A-D   *NO pepto Bismol   Hemorrhoids:  Anusol  Anusol HC  Preparation H  Tucks   Indigestion:  Tums  Maalox  Mylanta  Zantac  Pepcid   Insomnia:  Benadryl (alcohol free) 84m every 6 hours as needed  Tylenol PM  Unisom, no Gelcaps   Leg Cramps:  Tums  MagGel   Nausea/Vomiting:  Bonine  Dramamine  Emetrol  Ginger extract  Sea bands  Meclizine  Nausea medication to take during pregnancy:  Unisom (doxylamine succinate 25 mg tablets) Take one tablet daily at bedtime. If symptoms are not adequately controlled, the dose can be increased to a maximum recommended dose of two tablets daily (1/2 tablet in the morning, 1/2 tablet mid-afternoon and one at bedtime).  Vitamin B6 1029mtablets. Take one tablet twice a day (up to 200 mg per day).   Skin Rashes:  Aveeno products  Benadryl cream or 2540mvery 6 hours as needed  Calamine Lotion  1% cortisone cream   Yeast infection:  Gyne-lotrimin 7  Monistat 7    **If taking multiple medications, please check labels to avoid  duplicating the same active ingredients  **take medication as directed on the label  ** Do not exceed 4000 mg of tylenol in 24 hours  **Do not take medications that contain aspirin or ibuprofen          [facility] Providers  Central/Southeast [id number] [facility] BroOwens SharkD; ChaErin HearingD; EniGwendlyn DeutscherD; HenAndria FramesD; McDiarmid, MD; McIDutch QuintD CarrolltonGre[place]atawanC [phone]-Fri 8:30-12:30, 1:30-5:00  Providers come to see babies during newborn hospitalization Only accepting infants of Mother's who are seen at [facility] have siblings seen at   FamFairacresdicaid - Yes; TriMoffatD [id number]GreLa Grange ParkC [phone], Tue, Thur, Fri 8:30-5:00, Wed 10:00-7:00 (closed 1-2pm daily for lunch) [facility] Irmosidents with no insurance.  CotWest Endly with Medicaid/insurance; Tricare - no  ConPmg [facility] Hospital[facility] ([facility] Tim and CarClarkston Surgery CenterD; BroOwens SharkD; ChaTamera PuntD; EttDoneen PoissonD; GraFatima SangerD; HanLindwood QuaD; HerThornell SartoriusD; JonRonnald RampMD; LesWynetta EmeryD; McCJess BartersD; McQTami RibasD; SimDerrell LollingD; StaDorothyann PengD; StrLucious GrovesP 301[address], GreChetekC [phone], Tue, Thur, Fri 8:30-5:30, Wed 9:30-5:30, Sat 8:30-12:30 Only accepting infants of first-time parents or siblings of current patients [facility]  discharge coordinator will make follow-up appointment Medicaid - yes; Tricare - yes  [place] [phone]) [facility] Cox, MD; Rosana Hoes, MD; Servando Salina, MD; Rose Fillers, MD; Corinna Capra, MD; [facility], MD; [place], MD; Janann Colonel, MD; Jimmye Norman, [place], [place], [place] [address] [phone] Mon-Fri 8:30-5:00, closed for lunch 12:30-1:30; Sat-Sun 10:00-1:00 Accepting Newborns with commercial insurance only, must call prior to  delivery to be accepted into  practice.  Medicaid - no, Tricare - yes   [place], [place] [address] [phone] or [phone] Mon to Fri 8am to 10pm, Sat 8am to 1pm (virtual only on weekends) Only accepts Medicaid Healthy Blue pts  [facility] ([facility]) - Pediatrics at Rogelia Boga, MD; Vilma Prader, MD; Vanita Panda, MD; Roxanne Mins, NP; Nestor Lewandowsky, MD; Ronney Lion, MD [address]., [place], [place] [address] [phone] Mon-Fri 8:30-5:30 Medicaid - yes, Tricare - yes  [place] [phone]) [facility] Pop, MD [address] [place] [address], [place], [place] [address] [phone] Sammuel Cooper, Wed Fri 8:30-5:00, Sat 8:30-12:00, Closed Thursdays Accepting siblings of established patients and first time mom's if you call prenatally Medicaid- yes; Tricare - yes  [place] at [place]ina Robes, [place]; Mannie Stabile, MD; Jerald Kief; [place], [place]; Nancy Fetter, MD; Moreen Fowler, MD;  [address] [place], [place] [address] [phone] Mon-Fri 8:30-5:00, closed for lunch 1-2 Only accepting newborns of established patients Medicaid- no; Tricare - yes  [facility] [phone]) [place] at Leane Para, MD; Fowler, [place], [place] [address] [phone] Mon-Fri 8:00-5:00 Medicaid - No; Tricare - Yes  [place] at [facility], [place]; [place], [place], [place], [place] [address] [phone] Mon-Fri 8:00-5:00 Medicaid - No, Tricare - Yes  [facility] Abner Greenspan, MD; Sheran Lawless, MD; [place], [place] [address] [place] [address] [place], [place] [address] [phone]  Mon-Fri 8:00-5:00 Medicaid - No; Tricare - Yes  [place]., [place], [place] [address] [phone] Mon-Fri 8:30-5:00 (lunch 12:00-1:00) Medicaid -Yes; [place] at [place], MD [place],  [place], [place] [address] [phone] Mon-Fri 8:00-5:00 Seeing newborns of current patients only. No new patients Medicaid - No, Tricare - yes  Therapist, music at [place], [place] [address] [place], [place] [address] [phone] Mon-Fri 8:00-5:00 Medicaid -yes as secondary coverage only; Tricare - yes  [place], [place]; [place], [place]; Albertina Parr, MD; Frederic Jericho, MD; Ronney Lion, MD; [place], [place]; Smoot, NP; Corinna Lines, MD; Edmond, MD [address] [place], [place] [address] [phone] Mon-Fri 8:30-5:00, Sat 9:00-11:00 Accepts commercial insurance ONLY. Offers free prenatal information sessions for families. Medicaid - No, Tricare - Call first  [place], MD; [place], [place]; [place], [place]; [place], [place]., [place] [address] [phone] Mon-Fri 7:30-5:30 Medicaid - Yes; Marchelle Gearing yes  [place] [phone] & [phone])  [facility], [place] [address]., [place], [place] [address] [phone] Mon-Thur 8:00-6:00, closed for lunch 12-2, closed Fridays Medicaid - yes; Tricare - no  [place], NP; Melford Aase, MD; [place], [place]; [place], [place] [address]., [place], [place], [place] [address] [phone] Mon-Fri 7:30-4:30 Medicaid - yes, Tricare - yes  [facility]  Carolynn Sayers, MD; Cristino Martes, NP; Gertie Baron, MD; Debara [place], NP [place], [place], [place] [address] [phone] Mon-Fri 8:30-5:00, closed for lunch 1-2, Sat 8:30-12:00 - sick visits only Providers come to see babies at  babies at [facility] Only accepting newborns of siblings and first time parents ONLY if who have met with office prior to delivery Medicaid -Yes; Tricare - yes  [facility]  Golden, DO; Friddle, NP; Wallace, MD; Wood, MD:  [address], [place], [place] [address] [phone] Mon- Fri 8:00-5:00, Sat 9:00-12:00 - sick  visits only Accepting siblings of established patients and first time mom/baby Medicaid - Yes; Tricare - yes Patients must have vaccinations (baby vaccines)  [place] ([address])  [facility] [place] [address] [place], [place] [address] [phone] Mon-Fri 8:00-5:00 Medicaid - no; Tricare - yes  [facility] Briscoe, MD; Schmidt, PA; [place], PA [address], [place], [place] [address] [phone] Mon-Fri 8:00-5:00 Medicaid- yes; Tricare - yes  [facility] [place] Boyd, MD; Jones, NP; Osborn, PA [address], [place], [place] [address] [phone] Mon-Fri 8:00-5:00 Medicaid - Yes; Tricare - yes  [place] ([address])  [facility] Atkinson, PA; Calderon, PA; Cummings, MD; Dillard, MD; Henrish, NP; Isenhour, DO; Martin, PA; Olson, MD; Ott, MD; Phillips, MD; Valente, PA; VanDeven, PA; Yonjof, NP [address], [place], [place] [address] [phone] Mon-Fri 8:30-5:00, Sat 9:00-12:00 - sick only Please register online [email or web address] then schedule online or call office Medicaid-Yes; Tricare -yes  [facility]  Dabrusco, MD; Dial, MD; Owenton, MD; Fleenor, NP; Goolsby, PA; Tonuzi, MD; Turner, NP; West, MD [address], [place], [place] [address] [phone] Mon-Fri 8:00-5:30, Sat&Sun by appointment (phones open at 8:30) Medicaid - Yes; Tricare - yes  [place] ([address]) [place] [facility] Allen, CPNP; Bates, MD; Gordon, MD; Mills, NP; Weinshilboum, DO [address], [place], [place] [address] [phone] M-F 8:00 - 5:15, Sat/Sun 9-12 sick visits only Medicaid - No; Tricare - yes  [facility] [place] [facility]  Brown, PA-C; Cowen, PA-C; Dennis, DO; Fuster, PA-C; Martin, PA-C; Shelton,  PA-C; Spry, MD [address] [place], [place] [address] [phone] Mon-Thur 8:00-7:00, Fri 8:00-5:00 Accepting Medicaid for 13 and under only   [facility] at [place] [facility] [place]) Hayes, FNP; List, FNP; Moran, MD; Pitonzo, PA-C; Scholer, MD; Spangle, FNP; Nzenwa, FNP; Jasper, MD; Moran, MD [address] [place], [place] [address] [phone] Mon-Fri 8:30-5:30 Medicaid - Yes; Tricare - yes  [facility] [address]  Kelly, CPNP; Logan, MD; Poth, MD; Ramadoss, MD; Staton, NP [address], [place], [place] [address] [phone] Mon-Thur 8:00-5:30, Fri 8:00-5:00, Sat 9:00-12:00 Medicaid - yes, Tricare - yes  [place] ([address])  [facility] [place] Masneri, DO; Meyers, MD; Nelson, PA [address], [place], [place] [address] [phone] Mon-Fri 8:00-5:00, closed for lunch 12-1 Medicaid - No; Tricare - yes  [facility] [place] McGowen, MD [address], [place], [place] [address] [phone] Mon-Fri 8:00-5:00 Medicaid - No; Tricare - yes  [facility] [place] MacDonald, MD; Nayak, MD; Kearns, MD; Jones, MD [address] [place] [address], [place],  [address] [phone] Mon-Fri 8:00-5:00 Medicaid- Yes; Tricare - yes  [place] ([address])  [facility] [place] Martin, PA-C; Tabori, MD [address], [place],  [address] [phone] Mon-Fri 8:00-5:00 Medicaid - No; Tricare - yes  [facility] [place]  Margin - CPNP [address], [place], [place] [address] [phone] Mon-Weds 8:00-6:00, Thurs-Fri 8:00-5:00, Sat 9:00-12:00 Medicaid - yes; Tricare - yes   [facility] [place] Aubuchon, MD; Brandon, PA [address] [place], [place] [address] [phone] Mon-Fri 8:00-5:00 Medicaid - yes; Tricare - yes  [place]  Pediatric Providers  [facility] [address], [place], [place] [address] [phone] M, Thur: 8am -8pm, Tues, Weds: 8am - 5pm; Fri: 8-1 Medicaid - Yes; Tricare - yes  [facility] Mertz, MD; Johnson, MD; Wells, MD; Downs, PA; Hockenberger, PA [address], [place], [place] [address] [phone] M-F 8:30 - 5:00 Medicaid - Call office; Tricare -yes  [facility] Bonney, MD; Page, MD, Minter, MD; Mueller, PNP; Thomason, NP [address], [place], [place] [address] [phone] M-F 8:30 - 5:00, Sat/Sun 8:30 - 12:30 (sick visits) Medicaid - Call office; Tricare -yes  [facility] Lewis, MD; Shaub, PNP; Boylston, MD; Quaile, PA; Nonato, NP; Landon, CPNP [address], [place], [place] [address] [phone] M-F 8:30 - 5:00 Medicaid - Call office; Tricare - yes  [facility] [place] Cline, MD; Dvergsten, MD; Flores, MD; Kawatu, MD; Nogo, MD [address], [place], [place] [address] [phone] M-Thur: 8:00 - 5:00; Fri: 8:00 - 4:00 Medicaid - yes; Tricare - yes  [facility] [address] [place], [place], [place] [address] [phone] M-F: 8:30- 5:00, closed for lunch 12:30 - 1:00 Medicaid - yes; Tricare -yes  [facility] [place] [address], [place], [place] [address] [phone] M-F 8:00 - 5:00 Medicaid - yes; Tricare - yes  [facility] Johnson, DO; Rumball, DO; Wicker, NP [address], [place], [place] [address] [phone] M-F 8:00 - 5:00, Closed 12-1 for lunch Medicaid - Call; Tricare - yes  [facility] Stein, MD [address], [place], [place] [address] [phone] M-F: 8:00-5:00, Sat: 8:00 - noon Medicaid - call; Tricare -yes  [place] Pediatric Providers  [facility] Collins, FNP-C [address], [place], [place] [address] [phone] M-W: 8:00-5:00, Thur: 8:00 -  7:00, Fri: 8:00 - noon Medicaid - yes; Tricare - yes  [facility] [place] Adams, FNP [address], [place], [place] [address] [phone] M-F 8:00 - 5:00, Closed for lunch 12-1 Medicaid - yes; Tricare - yes  [place] Pediatric Providers  [facility] Smith, FNP, Melvin, MD, Fay, FNP-C [address], [place] [address] [place], [place], [place] [address] [phone] M-T 8:00-5:00, Wed-Fri 7:00-6:00 Medicaid - Yes; Tricare -yes  [facility] [place] Civiletti, DO; [address], [place], [place] [address] [phone] M-F 8:00 - 5:00, closed for lunch 12-1 Medicaid - Yes; Tricare - yes  [facility]  Barnes, MD; Bergdolt, MD; [place], MD; Emrich, MD; Fiscus, MD; Hoppens, MD; Kylstra, MD, McPherson, MD; Todd, MD; Prestwood, MD; Waters, MD; Wood, MD [address], [place],  [address] [phone] M-F 8:00-5:00 Medicaid - yes; Tricare - yes  [facility] Cheema, MD (speaks Punjabi and Hindi) [address] [place],  [address] [phone] M-F: 8:30 - 5:00, closed 12:30 - 1 for lunch Medicaid - Yes; Tricare -yes  [place] Pediatric Providers  [facility] Loda, MD; Timberlake, MD; Burke,   MD [address], [place], [place] [address] [phone] M-Th: 8:00 - 5:30, Fri: 8:00 - 12:00 Medicaid - yes; Tricare - yes  [facility] [place] Lookabill, NP; Meier, MD; Daffron, MD [address], [place], [place] [address] [phone] M-F: 8:00 - 5:00 Medicaid - yes; Tricare - yes  [facility] Busse, NP; Bowman, NP; Baune, NP; Entwistle, MD; Williams, MD, Huffman, NP, Ferguson, MD; Patel, DO [address], [place], [place] [address] [phone] M-F: 8:30 - 5:30p Medicaid - yes; Tricare - yes Other locations available as well  [place] [facility] Rajan, MD; Wilson, MD; Morgan, PA-C, Domenech, PA-C; Myers, PA-C [address], [place], [place] [address] [phone] M-W: 8:00am - 7:00pm, Thurs: 8:00am - 8:00pm; Fri: 8:00am - 5:00pm, closed daily from 12-1 for lunch Medicaid - yes; Tricare - yes  [place] Pediatric Providers  [facility] [place] Adams, MD; Crystal, FNP; Hadley, MD; Stokes, MD; Johnson, PNP; Brady, PA-C; West, PNP; Gardner, MD;  [address], [place], [place] [address] [phone] M - Fri: 8am - 5pm, Sat 9-noon Medicaid - Yes; Tricare -yes  [facility] [place] Nayak, MD; Jones, FNP; McDonald, MD; Kearns, MD [address] [place] [address], [place], [place] [phone] M-F 8:00 - 5:00 Medicaid - call; Tricare - yes  [facility] [place] Bell, MD; [place], PNP; Pinder, MD; Anderson, MD; Light, PA-C; Johnson, MD; Latta, MD; Saul, PNP; Rainey, MD; Clifford, MD; McClung, MD [address], [place], [place] [address] [phone] M-F 8:00am - 5:00pm; Sat. 9:00 - 11:00 Medicaid - yes; Tricare - yes  [facility] [place] Soldato-Couture, MD [address], [place], [place] [address] [phone] M-F 8:00 - 5:00 Medicaid - [place] Medicaid only; Tricare - yes  [facility] [place] Walker, MD; Davis, PNP; Ajizian, MD [address] [place], [place], [place] [address] [phone] M-F 8:00 - 5:00 Medicaid - yes; Tricare - yes  [facility] [place] Barry, MD; Brown, MD, Forest, MD, Hazek, MD; Hoyle, MD; Smith, MD; [address], [place], [place] [address] [phone] M-F: 8-5 Medicaid - yes; Tricare - yes  [facility] [place] Brady, Md; Dowlen, MD; [address] [place], [place], [place] [address] [phone] M-F 8-5 Medicaid - yes; Tricare - yes  [facility] Kearns, MD; Nayak, MD; Soldato-Courture, MD; Pellam-[place], DNP;  Herring, PNP [address], , [place] [address] [phone] M-F 8-5 Medicaid - yes; Tricare - yes  [facility] Weathers, MD; Merritt, PA-C; Davis-PA-C; W[place], MD [address], [place], [place] [address] [phone] M-F 7am - 5 pm Medicaid - call; Tricare - yes  [facility] Hill, PNP; Erickson, MD; Robinson, MD [address], [place], [place] [address] [phone] M-F 8-5 Medicaid - yes; Tricare - yes  [facility] Kribbs, MD; Warner, MD; Williams, FNP; Brooks, FNP; Boles, FNP; Romblad, PA-C; [place] - FNP [address], [place], [place] [address] [phone] M-F 8-5 Medicaid- yes; Tricare - yes  [facility] [place], [place], [place] and [place] Yoder, MD; Verenes, MD; Armentrout, MD; Stewart, MD; Beasley, CPNP; Ford, MD; Erickson, MD; Rice, MD [address], [place], [place] [address] [phone] M-F: 8-5, Sat: 9-4, Sun 9-12 Medicaid - yes; Tricare - yes  [facility] Little, PNP; Davis, PNP [address], [place],   [phone] M-F 8 - 5, closed 12-1 for lunch Medicaid - yes; Tricare - yes  [place], MD; Enid Derry, MD; Benjamine Mola, MD; [place], [place], [employer] [place], [place] [address] [phone] M-F 8- 5:30 Medicaid - yes; Tricare - yes  [place], MD; Jeanell Sparrow, MD; Bartholome Bill, [place], [place], [place] [address] [phone] Jerilynn Mages: Sharyne Richters; Tues-Fri: 8-5; Sat: 9-12 Medicaid - yes; Tricare - yes  [facility] - Myrene Buddy, MD; Bjorn Loser, MD; Carlis Abbott, MD; Claudean Kinds, MD; Erik Obey, MD [address], [place], [place] [address] [phone] Jerilynn MagesMarland Kitchen Sharyne RichtersDarrin Luis: 8-5; Sat: 8:30-12:30 Medicaid - yes;  Tricare - yes  Dareen Piano [place], MD; [place], [place], [place], [place] [address] [phone] Mon-Fri: 8-5 Medicaid - yes; Tricare - yes  [facility] [place] Beasley, CPNP; [place], [place]; Benjamine Mola, MD; Donivan Scull, MD; Noel Journey, MD; [address] [place], [place] [address] [phone] M-F: 8-5, closed 1-2 for lunch Medicaid - yes; Tricare - yes  [place], [place]; [place], [place]; Tamala Julian, MD; [place], CPNP; [place], [place]; [place], MD; Juleen China, MD [address] [place], [place], [place] [address] [phone] M-Thurs: Sharyne Richters; Fri: 8-6; Sat: 9-12; Sun 2-4 Medicaid - yes; Tricare - yes  [facility] [place], MD; Vernard Gambles, MD; Carol Ada, FNP; Marjorie Smolder, DO; [address], [place], [place] [address] [phone] M-F: 8-5 Medicaid - yes; [place] - yes  [facility] Pediatric Providers  [place], MD; [place], [place], [place], [place] [address] [phone] M - Fri: 8am - 5pm, closed for lunch 12-1 Medicaid - Yes; Tricare - yes  [facility] [place], MD; Ovid Curd, MD; Sanger, DO; Vinocur, MD;Hall, PA; Volanda Napoleon, [place]; Megan Salon, NP [phone] [address] [place], [place] [address] [phone] M-F 8:00 - 5:00, Sat 8:00 - 11:30 Medicaid - yes; Tricare - yes  [facility] Humphrey Rolls, MD; [place], MD, [address], MD, [place], MD, Wilder, MD; Sanborn, NP; [place], [place];  [address] [place], [place] [address] [phone] M-F 8:10am - 5:00pm Medicaid - yes; [place] - yes  [place] Ranchos, MD; [place], NP [address], [place], [place] [address] [phone] M-F 8:00 - 5:00 Medicaid - [place] Medicaid only; Tricare - yes  [place], MD; [place], NP [place], [place], [place] [address] [phone] M-F 8:00 - 5:00; Closed for lunch 12 - 1:00 Medicaid - yes; Tricare - yes  [place], MD; Darius Bump, [place], [place], [place] [address] [phone] Mon 9-5; Tues/Wed 10-5; Thurs 8:30-5; Fri: 8-12:30 Medicaid - yes; Tricare - yes  [facility] Pediatric Providers  [facility]  [place], MD; [place], [place] [address], [place], [place] [address] [phone] phone [phone] fax M-F 7:15 - 4:30 Medicaid - yes; Tricare - yes  [facility] Anastasio Champion, MD; Rotonda, DO [address], [place], [place] [address] [phone] M-Fri: 8:30 - 5:00, closed for lunch everyday noon - 1pm Medicaid - Yes; Tricare - yes  [facility] Burd[place], MD; Quillian Quince, MD; Nadara Mustard, MD; Quintin Alto, MD; [place], [place]; Antony Blackbird, [place]; Johnson, [place]; [place], [place]; [place],  PA [address] [place], [place] [address] [phone] M-Thurs: 7:30am - 7:00pm; Friday 7:30am - 4pm; Sat: 8:00 - 1:00 Medicaid - Yes; Tricare - yes  [facility] Morton, MD; Conni Elliot, MD; Carroll Kinds, MD; [place], DO [address], [place], [place] [address] [phone] M-Thur: 8:00 - 5:00, Fri: 8:00 - Noon Medicaid - yes; Tricare - yes No [place] [employer]  [place] [facility] Dettinger, MD; Nadine Counts, DO; [place], NP; Daphine Deutscher, NP; Lequita Halt, NP; Ellamae Sia, NP; Reginia Forts, NP; Darlyn Read, MD; [place], [place] [address] [place], [place] [address] [phone] M-F 8:00 - 5:00 Medicaid - yes; Tricare - yes  [facility], FNP-C; Bucio, FNP-C [address] [place], [place] [address] [phone] M, W, R 8:00-5:00, Tues: 8:00am - 7:00pm; Fri 8:00 - noon Medicaid - Yes; Tricare - yes  [facility], MD [address] [place], [place] [address] [phone]  M-Thurs 8:30-5:30, Fri: 8:30-12:30pm Medicaid - Yes; Tricare - N   Considering Waterbirth? Guide for patients at Center for [employer] [facility]) Why consider waterbirth? Gentle birth for babies  Less pain medicine used in labor  May allow for passive descent/less pushing  May reduce perineal tears  More mobility and instinctive maternal position changes  Increased maternal relaxation   Is waterbirth safe? What are the risks of infection, drowning or other complications? Infection:  Very low risk (3.7 % for tub vs 4.8% for bed)  7 in 8000 waterbirths with documented infection  Poorly cleaned equipment most common cause  Slightly lower group B strep transmission rate  Drowning  Maternal:  Very low risk  Related to seizures or fainting  Newborn:  Very low risk. No evidence of increased risk of respiratory problems in multiple large studies  Physiological protection from breathing under water  Avoid underwater birth if there are any fetal complications  Once baby's head is out of the water, keep it out.  Birth complication  Some reports of cord trauma, but risk decreased by bringing baby to surface gradually  No evidence of increased risk of shoulder dystocia. Mothers can usually change positions faster in water than in a bed, possibly aiding the maneuvers to free the shoulder.   There are 2 things you MUST do to have a waterbirth with [facility]: Attend a waterbirth class at [facility] at [facility]   3rd Wednesday of every month from 7-9 pm (virtual during COVID) [employer] at [email or web address] or [email or web address] or by calling [phone] Bring [place] the certificate from the class to your prenatal appointment or send via MyChart Meet with a midwife at 36 weeks* to see if you can still plan a waterbirth and to sign the consent.   *We also recommend that you schedule as many of your prenatal  visits with a midwife as possible.    Helpful information: You may want to bring a bathing suit top to the hospital to wear during labor but this is optional.  All other supplies are provided by the hospital. Please arrive at the hospital with signs of active labor, and do not wait at home until late in labor. It takes 45 min- 1 hour for fetal monitoring, and check in to your room to take place, plus transport and filling of the waterbirth tub.    Things that would prevent you from having a waterbirth: Premature, <37wks  Previous cesarean birth  Presence of thick meconium-stained fluid  Multiple gestation (Twins, triplets, etc.)  Uncontrolled diabetes or gestational diabetes requiring medication  Hypertension diagnosed in pregnancy or preexisting hypertension (gestational hypertension, preeclampsia, or chronic hypertension) Fetal growth restriction (your baby measures less than 10th percentile on ultrasound) Heavy vaginal bleeding  Non-reassuring fetal heart rate  Active infection (MRSA, etc.). Group B Strep is NOT a contraindication for waterbirth.  If your labor has to be induced and induction method requires continuous monitoring of the baby's heart rate  Other risks/issues identified by your obstetrical provider   Please remember that birth is unpredictable. Under certain unforeseeable circumstances your provider may advise against giving birth in the tub. These decisions will be made on a case-by-case basis and with the safety of you and your baby as our highest priority.    Updated 05/20/21

## 2022-10-22 ENCOUNTER — Other Ambulatory Visit (HOSPITAL_COMMUNITY)
Admission: RE | Admit: 2022-10-22 | Discharge: 2022-10-22 | Disposition: A | Discharge status: Home / Self Care | Payer: Medicaid Other | Source: Ambulatory Visit | Attending: Family Medicine | Admitting: Family Medicine

## 2022-10-22 ENCOUNTER — Ambulatory Visit (INDEPENDENT_AMBULATORY_CARE_PROVIDER_SITE_OTHER): Payer: Medicaid Other | Admitting: Obstetrics and Gynecology

## 2022-10-22 ENCOUNTER — Other Ambulatory Visit: Payer: Self-pay

## 2022-10-22 ENCOUNTER — Encounter: Payer: Self-pay | Admitting: Obstetrics and Gynecology

## 2022-10-22 VITALS — BP 111/70 | HR 107 | Wt 134.3 lb

## 2022-10-22 DIAGNOSIS — Z3492 Encounter for supervision of normal pregnancy, unspecified, second trimester: Secondary | ICD-10-CM

## 2022-10-22 DIAGNOSIS — O99012 Anemia complicating pregnancy, second trimester: Secondary | ICD-10-CM

## 2022-10-22 DIAGNOSIS — Z3A14 14 weeks gestation of pregnancy: Secondary | ICD-10-CM

## 2022-10-22 MED ORDER — GLYCOPYRROLATE 2 MG PO TABS
2.0000 mg | ORAL_TABLET | Freq: Three times a day (TID) | ORAL | 3 refills | Status: DC | PRN
Start: 2022-10-22 — End: 2023-03-07

## 2022-10-22 MED ORDER — FAMOTIDINE 20 MG PO TABS
20.0000 mg | ORAL_TABLET | Freq: Two times a day (BID) | ORAL | 3 refills | Status: DC
Start: 2022-10-22 — End: 2023-04-21

## 2022-10-24 LAB — CBC/D/PLT+RPR+RH+ABO+RUBIGG...
Antibody Screen: NEGATIVE
Basophils Absolute: 0 10*3/uL (ref 0.0–0.2)
Basos: 0 %
EOS (ABSOLUTE): 0.1 10*3/uL (ref 0.0–0.4)
Eos: 1 %
HCV Ab: NONREACTIVE
HIV Screen 4th Generation wRfx: NONREACTIVE
Hematocrit: 31.5 % — ABNORMAL LOW (ref 34.0–46.6)
Hemoglobin: 10.1 g/dL — ABNORMAL LOW (ref 11.1–15.9)
Hepatitis B Surface Ag: NEGATIVE
Immature Grans (Abs): 0 10*3/uL (ref 0.0–0.1)
Immature Granulocytes: 0 %
Lymphocytes Absolute: 1 10*3/uL (ref 0.7–3.1)
Lymphs: 15 %
MCH: 28.9 pg (ref 26.6–33.0)
MCHC: 32.1 g/dL (ref 31.5–35.7)
MCV: 90 fL (ref 79–97)
Monocytes Absolute: 0.3 10*3/uL (ref 0.1–0.9)
Monocytes: 4 %
Neutrophils Absolute: 5.1 10*3/uL (ref 1.4–7.0)
Neutrophils: 80 %
Platelets: 255 10*3/uL (ref 150–450)
RBC: 3.49 x10E6/uL — ABNORMAL LOW (ref 3.77–5.28)
RDW: 15.3 % (ref 11.7–15.4)
RPR Ser Ql: NONREACTIVE
Rh Factor: POSITIVE
Rubella Antibodies, IGG: <0.9 {index} — ABNORMAL LOW (ref >0.99)
WBC: 6.4 10*3/uL (ref 3.4–10.8)

## 2022-10-24 LAB — HCV INTERPRETATION

## 2022-10-24 LAB — ANEMIA PROFILE B
Ferritin: 27 ng/mL (ref 15–150)
Folate: 11.2 ng/mL (ref >3.0)
Iron Saturation: 43 % (ref 15–55)
Iron: 164 ug/dL — ABNORMAL HIGH (ref 27–159)
Retic Ct Pct: 2 % (ref 0.6–2.6)
Total Iron Binding Capacity: 378 ug/dL (ref 250–450)
UIBC: 214 ug/dL (ref 131–425)
Vitamin B-12: 235 pg/mL (ref 232–1245)

## 2022-10-25 LAB — CERVICOVAGINAL ANCILLARY ONLY
Bacterial Vaginitis (gardnerella): NEGATIVE
Candida Glabrata: NEGATIVE
Candida Vaginitis: POSITIVE — AB
Chlamydia: NEGATIVE
Comment: NEGATIVE
Comment: NEGATIVE
Comment: NEGATIVE
Comment: NEGATIVE
Comment: NEGATIVE
Comment: NORMAL
Neisseria Gonorrhea: NEGATIVE
Trichomonas: NEGATIVE

## 2022-10-25 NOTE — BH Specialist Note (Signed)
Integrated Behavioral Health via Telemedicine Visit  11/08/2022 Melinda Burch [id number]  Number of Integrated Behavioral Health Clinician visits: 1- Initial Visit  Session Start time: 0957   Session End time: 1028  Total time in minutes: 31   Referring Provider: Benson Bing, MD Patient/Family location: Home [facility] Provider location: Center for [facility]  All persons participating in visit: Patient Melinda Burch and [facility] Nayden Czajka   Types of Service: Individual psychotherapy and Video visit  I connected with Melinda Burch and/or Melinda Burch's  n/a  via  Telephone or Video Enabled Telemedicine Application  (Video is Caregility application) and verified that I am speaking with the correct person using two identifiers. Discussed confidentiality: Yes   I discussed the limitations of telemedicine and the availability of in person appointments.  Discussed there is a possibility of technology failure and discussed alternative modes of communication if that failure occurs.  I discussed that engaging in this telemedicine visit, they consent to the provision of behavioral healthcare and the services will be billed under their insurance.  Patient and/or legal guardian expressed understanding and consented to Telemedicine visit: Yes   Presenting Concerns: Patient and/or family reports the following symptoms/concerns: Attributes depressive symptoms to feeling overwhelmed in first trimester with hyperemesis, low energy, poor appetite; denies current SI, no intent, no plan. Pt's mood is improved, sleeping and eating well; good support at home. Duration of problem: Current pregnancy; Severity of problem: mild  Patient and/or Family's Strengths/Protective Factors: Social connections, Social and Emotional competence, Concrete supports in place (healthy food, safe environments, etc.), Sense of purpose, and Physical Health (exercise, healthy  diet, medication compliance, etc.)  Goals Addressed: Patient will:  Maintain reduction of symptoms of: depression   Increase knowledge and/or ability of: healthy habits   Demonstrate ability to: Increase motivation to adhere to plan of care  Progress towards Goals: Ongoing  Interventions: Interventions utilized:  Motivational Interviewing, Psychoeducation and/or Health Education, and Link to [employer] Standardized Assessments completed: GAD-7 and PHQ 9  Patient and/or Family Response: Patient agrees with treatment plan.   Assessment: Patient currently experiencing At risk for depression, perinatal.   Patient may benefit from psychoeducation and brief therapeutic interventions regarding maintaining reduction of depressive symptoms.  Plan: Follow up with behavioral health clinician on : Call Harley Mccartney at [phone], as needed. Behavioral recommendations:  -Begin taking prenatal vitamins as recommended -Continue prioritizing healthy self-care (regular meals, adequate rest; allowing practical help from supportive friends and family, as needed -Read through information on After Visit Summary; use as needed   Referral(s): Integrated [employer] (In Clinic)  I discussed the assessment and treatment plan with the patient and/or parent/guardian. They were provided an opportunity to ask questions and all were answered. They agreed with the plan and demonstrated an understanding of the instructions.   They were advised to call back or seek an in-person evaluation if the symptoms worsen or if the condition fails to improve as anticipated.  Rae Lips, LCSW     11/08/2022   10:17 AM 10/22/2022    9:58 AM 11/19/2015   10:10 AM 09/24/2015    8:07 AM  Depression screen PHQ 2/9  Decreased Interest 1 2 0 0  Down, Depressed, Hopeless 0 2 0 0  PHQ - 2 Score 1 4 0 0  Altered sleeping 0 2 0 0  Tired, decreased energy 1 2 0 0  Change in appetite 0 2 0 0  Feeling bad  or  failure about yourself  0 1 0 0  Trouble concentrating 0 0 0 0  Moving slowly or fidgety/restless 0 2 0 0  Suicidal thoughts 0 1 0 0  PHQ-9 Score 2 14 0 0      11/08/2022   10:20 AM 10/22/2022    9:59 AM 11/19/2015   10:10 AM 09/24/2015    8:08 AM  GAD 7 : Generalized Anxiety Score  Nervous, Anxious, on Edge 0 1 0 0  Control/stop worrying 0 0 0 0  Worry too much - different things 0 0 0 0  Trouble relaxing 0 0 0 0  Restless 0 0 0 0  Easily annoyed or irritable 1 1 0 0  Afraid - awful might happen 0 0 0 0  Total GAD 7 Score 1 2 0 0

## 2022-10-25 NOTE — Progress Notes (Signed)
New OB Note  10/25/2022   Clinic: Center for [facility]  Chief Complaint: new OB  Transfer of Care Patient: no  History of Present Illness: Ms. Olague is a 32 y.o. [id number] at 14/0 weeks [facility] 3/7 [tenative], based on Patient's last menstrual period was 07/16/2022 (exact date).).  Preg complicated by has Supervision of low-risk pregnancy on their problem list.   No s/s of PTL. Mild morning sickness s/s   ROS: A 12-point review of systems was performed and negative, except as stated in the above HPI.  OBGYN History: As per HPI. OB History  Gravida Para Term Preterm AB Living  4 2 2   1 2   SAB IAB Ectopic Multiple Live Births    1   0 2    # Outcome Date GA Lbr Len/2nd Weight Sex Type Anes PTL Lv  4 Current           3 Term 07/15/17 [redacted]w[redacted]d 01:28 / 00:14 7 lb 14.1 oz (3.575 kg) M Vag-Spont None  LIV  2 Term 10/14/15 [redacted]w[redacted]d 12:30 / 00:11 7 lb 8.1 oz (3.405 kg) M Vag-Spont None  LIV  1 IAB 03/2014           Past Medical History: Past Medical History:  Diagnosis Date   Anemia    Anemia affecting pregnancy in third trimester 05/30/2017   HGB 9.3 at 32 weeks     Infection    UTI/ Trich   Normal labor 07/15/2017   Rubella non-immune status, antepartum 04/28/2017   Sickle cell trait (HCC) 04/05/2017   Supervision of other normal pregnancy, antepartum 04/05/2017            Clinic     [facility]    Prenatal Labs      Dating     BY LMP confirmed by anatomy US at 25 w 6d    Blood type:A positive         Genetic Screen    1 Screen:    AFP:     Quad:     NIPS:    Antibody: negative      Anatomic [place]     04-11-17 Normal  27w 4d by LMP is BEDC    Rubella:  NON IMMUNE      GTT    Early:               Third trimester:  1 hour was 93    RPR:   NR      Flu vaccine         HBsA   Trichomoniasis 07/07/2017   2 grams flagyl given in MAU on 5-12;  POC 07/15/17 still + (pt states hasn't had sex since treatment):  IV flagyl given 07/15/17 and rx for BID X 7 days sent to pharmacy       Past Surgical History: Past Surgical History:  Procedure Laterality Date   DILATION AND CURETTAGE OF UTERUS      Family History:  History reviewed. No pertinent family history.  Social History:  Social History   Socioeconomic History   Marital status: Single    Spouse name: Not on file   Number of children: Not on file   Years of education: Not on file   Highest education level: Not on file  Occupational History   Not on file  Tobacco Use   Smoking status: Never   Smokeless tobacco: Never  Vaping Use   Vaping status: Never Used  Substance and  Sexual Activity   Alcohol use: No   Drug use: No   Sexual activity: Yes    Birth control/protection: None  Other Topics Concern   Not on file  Social History Narrative   Not on file   Social Determinants of Health   Financial Resource Strain: Not on file  Food Insecurity: No Food Insecurity (02/25/2021)   Received from [facility], [facility]   Hunger Vital Sign    Worried About Running Out of Food in the Last Year: Never true    Ran Out of Food in the Last Year: Never true  Transportation Needs: Not on file  Physical Activity: Not on file  Stress: Not on file  Social Connections: Unknown (06/30/2021)   Received from [facility]   Social Network    Social Network: Not on file  Intimate Partner Violence: Unknown (05/22/2021)   [facility], [facility]   HITS    Physically Hurt: Not on file    Insult or Talk Down To: Not on file    Threaten Physical Harm: Not on file    Scream or Curse: Not on file    Allergy: No Known Allergies  Current Outpatient Medications: Prenatal vitamin   Physical Exam:   BP 111/70   Pulse (!) 107   Wt 134 lb 4.8 oz (60.9 kg)   LMP 07/16/2022 (Exact Date)   BMI 23.05 kg/m  Body mass index is 23.05 kg/m.   Vag. Bleeding: None. Fundal height: not applicable FHTs: 150s  General appearance: Well nourished, well developed female in no acute  distress.  Neck:  Supple, normal appearance, and no thyromegaly  Cardiovascular: S1, S2 normal, no murmur, rub or gallop, regular rate and rhythm Respiratory:  Clear to auscultation bilateral. Normal respiratory effort Abdomen: positive bowel sounds and no masses, hernias; diffusely non tender to palpation, non distended Neuro/Psych:  Normal mood and affect.  Skin:  Warm and dry.  Lymphatic:  No inguinal lymphadenopathy.   Pelvic exam: is not limited by body habitus EGBUS: within normal limits, Vagina: within normal limits and with no blood in the vault, Cervix: normal appearing cervix without discharge or lesions, closed/long/high, Uterus:  enlarged, c/w 14 week size, and Adnexa:  normal adnexa and no mass, fullness, tenderness  Laboratory: none  Imaging:  none  Assessment: patient doing well  Plan: 1. Encounter for supervision of low-risk pregnancy in second trimester Routine care. Offer afp next visit. Anaotmy u/s already scheduled. Pregnancy navigator not here today>>pt can see next visit - CBC/D/Plt+RPR+Rh+ABO+RubIgG... - PANORAMA PRENATAL TEST - [place] Basic Panel - Cytology - PAP( [place]) - Cervicovaginal ancillary only( [place]) - Ambulatory referral to Integrated Behavioral Health - Anemia Profile B  2. [redacted] weeks gestation of pregnancy  Problem list reviewed and updated.   >50% of 30 min visit spent on counseling and coordination of care.  Return in 5 weeks (on 11/26/2022) for md or app, low risk ob, in person.  Future Appointments  Date Time Provider Department Center  11/08/2022  9:45 AM [facility] HEALTH CLINICIAN [facility]  11/26/2022  9:15 AM [facility] NURSE [facility]  11/26/2022  9:30 AM [facility] US1 [facility]  11/26/2022 10:55 AM Carlynn Herald, CNM [facility]    Cornelia Copa MD Attending Center for [facility])

## 2022-10-26 ENCOUNTER — Encounter: Payer: Self-pay | Admitting: Obstetrics and Gynecology

## 2022-10-26 DIAGNOSIS — O99019 Anemia complicating pregnancy, unspecified trimester: Secondary | ICD-10-CM | POA: Insufficient documentation

## 2022-10-26 LAB — CYTOLOGY - PAP
Comment: NEGATIVE
Diagnosis: NEGATIVE
High risk HPV: NEGATIVE

## 2022-10-26 MED ORDER — FERROUS SULFATE 324 MG PO TBEC: 324.0000 mg | DELAYED_RELEASE_TABLET | ORAL | 1 refills | Status: DC

## 2022-10-26 NOTE — Addendum Note (Signed)
Addended by: Valdez Bing on: 10/26/2022 08:49 PM   Modules accepted: Orders

## 2022-10-31 LAB — PANORAMA PRENATAL TEST FULL PANEL:PANORAMA TEST PLUS 5 ADDITIONAL MICRODELETIONS: FETAL FRACTION: 11.2

## 2022-11-01 ENCOUNTER — Encounter: Payer: Self-pay | Admitting: Obstetrics and Gynecology

## 2022-11-01 LAB — HORIZON CUSTOM: REPORT SUMMARY: POSITIVE — AB

## 2022-11-02 ENCOUNTER — Telehealth: Payer: Self-pay

## 2022-11-02 NOTE — Telephone Encounter (Addendum)
-----   Message from Luther sent at 11/01/2022  7:00 PM EDT ----- Please set her up with natera or MFM genetic consults for this.   Called pt; reviewed [facility] results. Pt previously aware she is a carrier of sickle cell. Offered partner testing. She will talk with FOB and notify office by MyChart or at next visit. Phone number given for Avelina Laine genetic counseling if desired. Also reviewed Panorama result (low risk female) at patient request.

## 2022-11-08 ENCOUNTER — Ambulatory Visit (INDEPENDENT_AMBULATORY_CARE_PROVIDER_SITE_OTHER): Payer: Medicaid Other | Admitting: Clinical

## 2022-11-08 DIAGNOSIS — Z9189 Other specified personal risk factors, not elsewhere classified: Secondary | ICD-10-CM | POA: Diagnosis not present

## 2022-11-08 NOTE — Patient Instructions (Signed)
Center for [facility] at [facility] [address] [place], [place] [address] [phone] (main office) [phone] [facility] office)  [email or web address]       BRAINSTORMING  Develop a Plan Goals: Provide a way to start conversation about your new life with a baby Assist parents in recognizing and using resources within their reach Help pave the way before birth for an easier period of transition afterwards.  Make a list of the following information to keep in a central location: Full name of Mom and Partner: _____________________________________________ Baby's full name and Date of Birth: ___________________________________________ Home Address: ___________________________________________________________ ________________________________________________________________________ Home Phone: ____________________________________________________________ Parents' cell numbers: _____________________________________________________ ________________________________________________________________________ Name and contact info for OB: ______________________________________________ Name and contact info for Pediatrician:________________________________________ Contact info for Lactation Consultants: ________________________________________  REST and SLEEP *You each need at least 4-5 hours of uninterrupted sleep every day. Write specific names and contact information.* How are you going to rest in the postpartum period? While partner's home? When partner returns to work? When you both return to work? Where will your baby sleep? Who is available to help during the day? Evening? Night? Who could move in for a period to help support you? What are some ideas to help you get enough  sleep? __________________________________________________________________________________________________________________________________________________________________________________________________________________________________________ NUTRITIOUS FOOD AND DRINK *Plan for meals before your baby is born so you can have healthy food to eat during the immediate postpartum period.* Who will look after breakfast? Lunch? Dinner? List names and contact information. Brainstorm quick, healthy ideas for each meal. What can you do before baby is born to prepare meals for the postpartum period? How can others help you with meals? Which grocery stores provide online shopping and delivery? Which restaurants offer take-out or delivery options? ______________________________________________________________________________________________________________________________________________________________________________________________________________________________________________________________________________________________________________________________________________________________________________________________________  CARE FOR MOM *It's important that mom is cared for and pampered in the postpartum period. Remember, the most important ways new mothers need care are: sleep, nutrition, gentle exercise, and time off.* Who can come take care of mom during this period? Make a list of people with their contact information. List some activities that make you feel cared for, rested, and energized? Who can make sure you have opportunities to do these things? Does mom have a space of her very own within your home that's just for her? Make a "[facility]" where she can be comfortable, rest, and renew herself  daily. ______________________________________________________________________________________________________________________________________________________________________________________________________________________________________________________________________________________________________________________________________________________________________________________________________    CARE FOR AND FEEDING BABY *Knowledgeable and encouraging people will offer the best support with regard to feeding your baby.* Educate yourself and choose the best feeding option for your baby. Make a list of people who will guide, support, and be a resource for you as your care for and feed your baby. (Friends that have breastfed or are currently breastfeeding, lactation consultants, breastfeeding support groups, etc.) Consider a postpartum doula. (These websites can give you information: dona.org & https://shea.org/) Seek out local breastfeeding resources like the breastfeeding support group at [employer] or [employer]. ______________________________________________________________________________________________________________________________________________________________________________________________________________________________________________________________________________________________________________________________________________________________________________________________________  Judson Roch AND ERRANDS Who can help with a thorough cleaning before baby is born? Make a list of people who will help with housekeeping and chores, like laundry, light cleaning, dishes, bathrooms, etc. Who can run some errands for you? What can you do to make sure you are stocked with basic supplies before baby is born? Who is going to do the  shopping? ______________________________________________________________________________________________________________________________________________________________________________________________________________________________________________________________________________________________________________________________________________________________________________________________________     Family Adjustment *Nurture yourselves.it helps parents be more loving and allows for better bonding with their child.* What sorts of things do  you and partner enjoy doing together? Which activities help you to connect and strengthen your relationship? Make a list of those things. Make a list of people whom you trust to care for your baby so you can have some time together as a couple. What types of things help partner feel connected to Mom? Make a list. What needs will partner have in order to bond with baby? Other children? Who will care for them when you go into labor and while you are in the hospital? Think about what the needs of your older children might be. Who can help you meet those needs? In what ways are you helping them prepare for bringing baby home? List some specific strategies you have for family adjustment. _______________________________________________________________________________________________________________________________________________________________________________________________________________________________________________________________________________________________________________________________________________  SUPPORT *Someone who can empathize with experiences normalizes your problems and makes them more bearable.* Make a list of other friends, neighbors, and/or co-workers you know with infants (and small children, if applicable) with whom you can connect. Make a list of local or online support groups, mom groups, etc. in which you can be  involved. ______________________________________________________________________________________________________________________________________________________________________________________________________________________________________________________________________________________________________________________________________________________________________________________________________  Childcare Plans Investigate and plan for childcare if mom is returning to work. Talk about mom's concerns about her transition back to work. Talk about partner's concerns regarding this transition.  Mental Health *Your mental health is one of the highest priorities for a pregnant or postpartum mom.* 1 in 5 women experience anxiety and/or depression from the time of conception through the first year after birth. Postpartum Mood Disorders are the #1 complication of pregnancy and childbirth and the suffering experienced by these mothers is not necessary! These illnesses are temporary and respond well to treatment, which often includes self-care, social support, talk therapy, and medication when needed. Women experiencing anxiety and depression often say things like: "I'm supposed to be happy.why do I feel so sad?", "Why can't I snap out of it?", "I'm having thoughts that scare me." There is no need to be embarrassed if you are feeling these symptoms: Overwhelmed, anxious, angry, sad, guilty, irritable, hopeless, exhausted but can't sleep You are NOT alone. You are NOT to blame. With help, you WILL be well. Where can I find help? Medical professionals such as your OB, midwife, gynecologist, family practitioner, primary care provider, pediatrician, or mental health providers; [facility] support groups: Feelings After Birth, Breastfeeding Support Group, Baby and Me Group, and Fit 4 Two exercise classes. You have permission to ask for help. It will confirm your feelings, validate your experiences,  share/learn coping strategies, and gain support and encouragement as you heal. You are important! BRAINSTORM Make a list of local resources, including resources for mom and for partner. Identify support groups. Identify people to call late at night - include names and contact info. Talk with partner about perinatal mood and anxiety disorders. Talk with your OB, midwife, and doula about baby blues and about perinatal mood and anxiety disorders. Talk with your pediatrician about perinatal mood and anxiety disorders.   Support & Sanity Savers   What do you really need?  Basics In preparing for a new baby, many expectant parents spend hours shopping for baby clothes, decorating the nursery, and deciding which car seat to buy. Yet most don't think much about what the reality of parenting a newborn will be like, and what they need to make it through that. So, here is the advice of experienced parents. We know you'll read this, and think "they're exaggerating, I don't really need that." Just trust [place] on these, OK?  Plan for all of this, and if it turns out you don't need it, come back and teach [place] how you did it!  Must-Haves (Once baby's survival needs are met, make sure you attend to your own survival needs!) Sleep An average newborn sleeps 16-18 hours per day, over 6-7 sleep periods, rarely more than three hours at a time. It is normal and healthy for a newborn to wake throughout the night... but really hard on parents!! Naps. Prioritize sleep above any responsibilities like: cleaning house, visiting friends, running errands, etc.  Sleep whenever baby sleeps. If you can't nap, at least have restful times when baby eats. The more rest you get, the more patient you will be, the more emotionally stable, and better at solving problems.  Food You may not have realized it would be difficult to eat when you have a newborn. Yet, when we talk to countless new parents, they say things like "it may be 2:00 pm  when I realize I haven't had breakfast yet." Or "every time we sit down to dinner, baby needs to eat, and my food gets cold, so I don't bother to eat it." Finger food. Before your baby is born, stock up with one months' worth of food that: 1) you can eat with one hand while holding a baby, 2) doesn't need to be prepped, 3) is good hot or cold, 4) doesn't spoil when left out for a few hours, and 5) you like to eat. Think about: nuts, dried fruit, Clif bars, pretzels, jerky, gogurt, baby carrots, apples, bananas, crackers, cheez-n-crackers, string cheese, hot pockets or frozen burritos to microwave, garden burgers and breakfast pastries to put in the toaster, yogurt drinks, etc. Restaurant Menus. Make lists of your favorite restaurants & menu items. When family/friends want to help, you can give specific information without much thought. They can either bring you the food or send gift cards for just the right meals. Freezer Meals.  Take some time to make a few meals to put in the freezer ahead of time.  Easy to freeze meals can be anything such as soup, lasagna, chicken pie, or spaghetti sauce. Set up a Meal Schedule.  Ask friends and family to sign up to bring you meals during the first few weeks of being home. (It can be passed around at baby showers!) You have no idea how helpful this will be until you are in the throes of parenting.  Machine[employer].it is a great website to check out. Emotional Support Know who to call when you're stressed out. Parenting a newborn is very challenging work. There are times when it totally overwhelms your normal coping abilities. EVERY NEW PARENT NEEDS TO HAVE A PLAN FOR WHO TO CALL WHEN THEY JUST CAN'T COPE ANY MORE. (And it has to be someone other than the baby's other parent!) Before your baby is born, come up with at least one person you can call for support - write their phone number down and post it on the refrigerator. Anxiety & Sadness. Baby blues are normal after  pregnancy; however, there are more severe types of anxiety & sadness which can occur and should not be ignored.  They are always treatable, but you have to take the first step by reaching out for help. [facility] offers a "Mom Talk" group which meets every Tuesday from 10 am - 11 am.  This group is for new moms who need support and connection after their babies are born.  Call [phone].  Really, Really  Helpful (Plan for them! Make sure these happen often!!) Physical Support with Taking Care of Yourselves Asking friends and family. Before your baby is born, set up a schedule of people who can come and visit and help out (or ask a friend to schedule for you). Any time someone says "let me know what I can do to help," sign them up for a day. When they get there, their job is not to take care of the baby (that's your job and your joy). Their job is to take care of you!  Postpartum doulas. If you don't have anyone you can call on for support, look into postpartum doulas:  professionals at helping parents with caring for baby, caring for themselves, getting breastfeeding started, and helping with household tasks. www.padanc.org is a helpful website for learning about doulas in our area. Peer Support / Parent Groups Why: One of the greatest ideas for new parents is to be around other new parents. Parent groups give you a chance to share and listen to others who are going through the same season of life, get a sense of what is normal infant development by watching several babies learn and grow, share your stories of triumph and struggles with empathetic ears, and forgive your own mistakes when you realize all parents are learning by trial and error. Where to find: There are many places you can meet other new parents throughout our community.  [facility] offers the following classes for new moms and their little ones:  Baby and Me (Birth to Crawling) and Breastfeeding Support Group. Go to  [email or web address] or call [phone] for more information. Time for your Relationship It's easy to get so caught up in meeting baby's immediate needs that it's hard to find time to connect with your partner, and meet the needs of your relationship. It's also easy to forget what "quality time with your partner" actually looks like. If you take your baby on a date, you'd be amazed how much of your couple time is spent feeding the baby, diapering the baby, admiring the baby, and talking about the baby. Dating: Try to take time for just the two of you. Babysitter tip: Sometimes when moms are breastfeeding a newborn, they find it hard to figure out how to schedule outings around baby's unpredictable feeding schedules. Have the babysitter come for a three hour period. When she comes over, if baby has just eaten, you can leave right away, and come back in two hours. If baby hasn't fed recently, you start the date at home. Once baby gets hungry and gets a good feeding in, you can head out for the rest of your date time. Date Nights at Home: If you can't get out, at least set aside one evening a week to prioritize your relationship: whenever baby dozes off or doesn't have any immediate needs, spend a little time focusing on each other. Potential conflicts: The main relationship conflicts that come up for new parents are: issues related to sexuality, financial stresses, a feeling of an unfair division of household tasks, and conflicts in parenting styles. The more you can work on these issues before baby arrives, the better!  Fun and Frills (Don't forget these. and don't feel guilty for indulging in them!) Everyone has something in life that is a fun little treat that they do just for themselves. It may be: reading the morning paper, or going for a daily jog, or having coffee with a friend once a week, or going to a  movie on Friday nights, or fine chocolates, or bubble baths, or curling up with a good  book. Unless you do fun things for yourself every now and then, it's hard to have the energy for fun with your baby. Whatever your "special" treats are, make sure you find a way to continue to indulge in them after your baby is born. These special moments can recharge you, and allow you to return to baby with a new joy   PERINATAL MOOD DISORDERS: MATERNAL MENTAL HEALTH FROM CONCEPTION THROUGH THE POSTPARTUM PERIOD   _________________________________________Emergency and Crisis Resources If you are an imminent risk to self or others, are experiencing intense personal distress, and/or have noticed significant changes in activities of daily living, call:  911 [facility]: [phone]  [address], [place], [place], [address] Mobile Crisis: [phone] National Suicide Hotline: 988 Or visit the following crisis centers: Local Emergency Departments [facility]: [address] [employer]  [phone]. Hours: 8:30AM-5PM. Insurance Accepted: Medicaid, Medicare, and Uninsured.  [facility]:  [address] [place]  Mon-Friday 8am-3pm, [phone]                                                                                  ___________ Non-Crisis Resources To identify specific providers that are covered by your insurance, contact your insurance company or local agencies:  [place]: [phone] [facility]: [phone] [place]: 2628660357 Postpartum Support International- Warm-line: 5140916499                                                      __Outpatient Therapy and Medication Management   Providers:  [facility]: [phone] Hours: 9AM-5PM  Insurance Accepted: Pilar Jarvis, Chase Caller, [place], Medicare  [facility] of Care): [phone] Hours: 8AM-5:30PM  nsurance Accepted: All insurances EXCEPT AARP, [place],  [place], and [employer] of the [place]: [phone] Hours: 8AM-8PM Insurance Accepted: Ulla Gallo, Crista Luria, [place], Medicare, [phone] [facility]: [phone] Hours: 8:30AM-7PM Insurance Accepted: Ulla Gallo, Medicaid, Medicare, Tricare, [employer] [facility]:  [phone]   Hours:9AM-5PM Insurance Accepted:  Monia Pouch, Ezequiel Essex, [employer], [place], [employer] Care  [facility]: [phone] Hours: 9AM-5:30PM Insurance Accepted: Pilar Jarvis, Teodoro Spray, and Medicaid, Medicare, [facility]:  [phone] Hours: 9am-5pm Insurance Accepted: BCBS; they do not accept Medicaid/Medicare [facility]: [phone] Hours: 9am-9pm Insurance Accepted: All major insurance including Medicaid and Medicare [facility]: [phone] Hours: 9AM- 5PM Insurance Accepted: All insurances EXCEPT Medicaid and Medicare. [facility]: [phone]   ____________  Parenting Support Groups [facility]: [phone] [facility]:  [phone] Family Support Network: (support for children in the NICU and/or with special needs), [phone]   ___________                                                                 Mental Health Support Groups Mental Health Association: [phone]    _____________                                                                                  Online Resources Postpartum Support International: SeekAlumni.co.za  800-944-4PPD Supporting Moms:  www.momssupportingmoms.net

## 2022-11-26 ENCOUNTER — Other Ambulatory Visit: Payer: Self-pay | Admitting: Obstetrics and Gynecology

## 2022-11-26 ENCOUNTER — Ambulatory Visit: Payer: Medicaid Other

## 2022-11-26 ENCOUNTER — Other Ambulatory Visit: Payer: Self-pay | Admitting: *Deleted

## 2022-11-26 ENCOUNTER — Ambulatory Visit: Payer: Medicaid Other | Attending: Obstetrics and Gynecology

## 2022-11-26 ENCOUNTER — Encounter: Payer: Medicaid Other | Admitting: Certified Nurse Midwife

## 2022-11-26 DIAGNOSIS — O99012 Anemia complicating pregnancy, second trimester: Secondary | ICD-10-CM | POA: Insufficient documentation

## 2022-11-26 DIAGNOSIS — O09522 Supervision of elderly multigravida, second trimester: Secondary | ICD-10-CM

## 2022-11-26 DIAGNOSIS — Z363 Encounter for antenatal screening for malformations: Secondary | ICD-10-CM | POA: Insufficient documentation

## 2022-11-26 DIAGNOSIS — D573 Sickle-cell trait: Secondary | ICD-10-CM

## 2022-11-26 DIAGNOSIS — D259 Leiomyoma of uterus, unspecified: Secondary | ICD-10-CM

## 2022-11-26 DIAGNOSIS — Z3492 Encounter for supervision of normal pregnancy, unspecified, second trimester: Secondary | ICD-10-CM

## 2022-11-26 DIAGNOSIS — O3412 Maternal care for benign tumor of corpus uteri, second trimester: Secondary | ICD-10-CM | POA: Insufficient documentation

## 2022-11-26 DIAGNOSIS — Z3A19 19 weeks gestation of pregnancy: Secondary | ICD-10-CM | POA: Insufficient documentation

## 2022-11-26 DIAGNOSIS — Z3480 Encounter for supervision of other normal pregnancy, unspecified trimester: Secondary | ICD-10-CM | POA: Diagnosis present

## 2022-11-26 DIAGNOSIS — Z362 Encounter for other antenatal screening follow-up: Secondary | ICD-10-CM

## 2022-11-26 DIAGNOSIS — Z3A18 18 weeks gestation of pregnancy: Secondary | ICD-10-CM

## 2022-12-09 ENCOUNTER — Encounter: Payer: Medicaid Other | Admitting: Obstetrics and Gynecology

## 2022-12-13 ENCOUNTER — Telehealth: Payer: Self-pay

## 2022-12-13 NOTE — Telephone Encounter (Signed)
I left a voicemail asking patient to call back to discuss test results.

## 2022-12-16 ENCOUNTER — Telehealth: Payer: Self-pay

## 2022-12-16 NOTE — Telephone Encounter (Signed)
I left a voicemail asking patient to call back to discuss test results.

## 2022-12-20 ENCOUNTER — Telehealth: Payer: Self-pay

## 2022-12-20 NOTE — Telephone Encounter (Signed)
Patient returned our calls. We discussed her partner's carrier screening results Melinda Burch DOB [birth year]/13/1982). He was not found to be a carrier for beta-hemoglobinopathies, which includes sickle cell disease. Please see report for details. A negative result on carrier screening reduces but does not eliminate the chance of being a carrier. The chance this couple's current and future pregnancies would be affected with a beta-hemoglobinopathy is very low.  Sheppard Plumber, MS Genetic Counselor [facility] [phone]

## 2023-01-18 ENCOUNTER — Telehealth: Payer: Self-pay

## 2023-01-18 NOTE — Telephone Encounter (Signed)
Called to reschedule ultrasound appointment for Friday 01/21/23 from 7:30am to afternoon.  Need to move Detail, Anatomy from 12:30pm or 1:30pm to morning

## 2023-01-21 ENCOUNTER — Ambulatory Visit: Payer: Medicaid Other

## 2023-01-24 ENCOUNTER — Ambulatory Visit: Payer: Medicaid Other | Attending: Obstetrics

## 2023-01-24 DIAGNOSIS — O09522 Supervision of elderly multigravida, second trimester: Secondary | ICD-10-CM | POA: Diagnosis present

## 2023-01-24 DIAGNOSIS — O3412 Maternal care for benign tumor of corpus uteri, second trimester: Secondary | ICD-10-CM

## 2023-01-24 DIAGNOSIS — D259 Leiomyoma of uterus, unspecified: Secondary | ICD-10-CM | POA: Diagnosis not present

## 2023-01-24 DIAGNOSIS — Z362 Encounter for other antenatal screening follow-up: Secondary | ICD-10-CM | POA: Diagnosis present

## 2023-01-24 DIAGNOSIS — D571 Sickle-cell disease without crisis: Secondary | ICD-10-CM

## 2023-01-24 DIAGNOSIS — Z3A27 27 weeks gestation of pregnancy: Secondary | ICD-10-CM

## 2023-01-24 DIAGNOSIS — O99012 Anemia complicating pregnancy, second trimester: Secondary | ICD-10-CM

## 2023-02-01 ENCOUNTER — Ambulatory Visit: Payer: Medicaid Other

## 2023-02-16 NOTE — L&D Delivery Note (Signed)
 OB/GYN Faculty Practice Delivery Note  Melinda Burch is a 33 y.o. [id number] s/p NSVD at [redacted]w[redacted]d. She was admitted for ROM.   ROM: 16h 73m with clear fluid and light meconium GBS Status:  Negative/-- (02/10 1312) Maximum Maternal Temperature: 99.4  Labor Progress: Initial SVE: 1.5/50. She then progressed to complete without augmentation.   Delivery Date/Time: 04/19/23 1018 Delivery: Called to room and patient was complete and pushing. Head delivered. No nuchal cord present. Shoulder and body delivered in usual fashion. Infant with spontaneous cry, placed on mother's abdomen, dried and stimulated. Cord clamped x 2 after 1-minute delay, and cut by CNM. Cord blood drawn. Placenta delivered spontaneously with gentle cord traction. Fundus firm with massage and Pitocin. Labia, perineum, vagina, and cervix inspected without laceration requiring repair.  Baby Weight: pending  Placenta: 3 vessel, intact. Placenta to be kept by family. Complications: None Lacerations: None requiring repair EBL: 275 mL Analgesia: IV fentanyl last given ~2 hours prior to delivery   Infant:  APGAR (1 MIN):   APGAR (5 MINS):    Claria Dice, MD Family Medicine Resident, PGY-1 04/19/2023, 11:09 AM

## 2023-03-07 ENCOUNTER — Ambulatory Visit (INDEPENDENT_AMBULATORY_CARE_PROVIDER_SITE_OTHER): Payer: Medicaid Other | Admitting: Family Medicine

## 2023-03-07 ENCOUNTER — Other Ambulatory Visit: Payer: Self-pay

## 2023-03-07 VITALS — BP 109/72 | HR 123 | Wt 155.0 lb

## 2023-03-07 DIAGNOSIS — O99013 Anemia complicating pregnancy, third trimester: Secondary | ICD-10-CM | POA: Diagnosis not present

## 2023-03-07 DIAGNOSIS — Z3492 Encounter for supervision of normal pregnancy, unspecified, second trimester: Secondary | ICD-10-CM

## 2023-03-07 DIAGNOSIS — D573 Sickle-cell trait: Secondary | ICD-10-CM

## 2023-03-07 DIAGNOSIS — O09893 Supervision of other high risk pregnancies, third trimester: Secondary | ICD-10-CM | POA: Diagnosis not present

## 2023-03-07 DIAGNOSIS — O09899 Supervision of other high risk pregnancies, unspecified trimester: Secondary | ICD-10-CM

## 2023-03-07 DIAGNOSIS — Z3A33 33 weeks gestation of pregnancy: Secondary | ICD-10-CM | POA: Diagnosis not present

## 2023-03-07 DIAGNOSIS — Z3493 Encounter for supervision of normal pregnancy, unspecified, third trimester: Secondary | ICD-10-CM

## 2023-03-07 DIAGNOSIS — K117 Disturbances of salivary secretion: Secondary | ICD-10-CM

## 2023-03-07 DIAGNOSIS — Z2839 Other underimmunization status: Secondary | ICD-10-CM

## 2023-03-07 DIAGNOSIS — D259 Leiomyoma of uterus, unspecified: Secondary | ICD-10-CM

## 2023-03-07 DIAGNOSIS — O3413 Maternal care for benign tumor of corpus uteri, third trimester: Secondary | ICD-10-CM

## 2023-03-07 LAB — GLUCOSE, CAPILLARY: Glucose-Capillary: 110 mg/dL — ABNORMAL HIGH (ref 70–99)

## 2023-03-07 MED ORDER — GLYCOPYRROLATE 2 MG PO TABS: 2.0000 mg | ORAL_TABLET | Freq: Three times a day (TID) | ORAL | 3 refills | Status: DC | PRN

## 2023-03-07 NOTE — Progress Notes (Unsigned)
   PRENATAL VISIT NOTE  Subjective:  Melinda Burch is a 33 y.o. [id number] at [redacted]w[redacted]d being seen today for ongoing prenatal care.  She is currently monitored for the following issues for this {Blank single:19197::"high-risk","low-risk"} pregnancy and has Sickle cell trait (HCC); Rubella non-immune status, antepartum; Supervision of low-risk pregnancy; and Anemia in pregnancy on their problem list.  Patient reports {sx:14538}.  Contractions: Not present. Vag. Bleeding: None.  Movement: Present. Denies leaking of fluid.   The following portions of the patient's history were reviewed and updated as appropriate: allergies, current medications, past family history, past medical history, past social history, past surgical history and problem list.   Objective:   Vitals:   03/07/23 1619  BP: 109/72  Pulse: (!) 123  Weight: 155 lb (70.3 kg)    Fetal Status: Fetal Heart Rate (bpm): 139 Fundal Height: 36 cm Movement: Present     General:  Alert, oriented and cooperative. Patient is in no acute distress.  Skin: Skin is warm and dry. No rash noted.   Cardiovascular: Normal heart rate noted  Respiratory: Normal respiratory effort, no problems with respiration noted  Abdomen: Soft, gravid, appropriate for gestational age.  Pain/Pressure: Present     Pelvic: {Blank single:19197::"Cervical exam performed in the presence of a chaperone","Cervical exam deferred"}        Extremities: Normal range of motion.  Edema: None  Mental Status: Normal mood and affect. Normal behavior. Normal judgment and thought content.   Assessment and Plan:  Pregnancy: G4P2012 at [redacted]w[redacted]d 1. [redacted] weeks gestation of pregnancy (Primary) *** - CBC - POCT Glucose (CBG)  2. Encounter for supervision of low-risk pregnancy in third trimester ***  3. Rubella non-immune status, antepartum ***  4. Anemia during pregnancy in third trimester ***  5. Sickle cell trait (HCC) ***  6. Uterine fibroids affecting pregnancy in third  trimester ***  7. Ptyalism ***  8. Encounter for supervision of low-risk pregnancy in second trimester *** - glycopyrrolate (ROBINUL) 2 MG tablet; Take 1 tablet (2 mg total) by mouth 3 (three) times daily as needed.  Dispense: 30 tablet; Refill: 3  {Blank single:19197::"Term","Preterm"} labor symptoms and general obstetric precautions including but not limited to vaginal bleeding, contractions, leaking of fluid and fetal movement were reviewed in detail with the patient. Please refer to After Visit Summary for other counseling recommendations.   Return in about 2 weeks (around 03/21/2023) for Routine prenatal care, In person, GTT.  No future appointments.  Sundra Aland, MD

## 2023-03-08 LAB — CBC
Hematocrit: 25.6 % — ABNORMAL LOW (ref 34.0–46.6)
Hemoglobin: 8 g/dL — ABNORMAL LOW (ref 11.1–15.9)
MCH: 24.1 pg — ABNORMAL LOW (ref 26.6–33.0)
MCHC: 31.3 g/dL — ABNORMAL LOW (ref 31.5–35.7)
MCV: 77 fL — ABNORMAL LOW (ref 79–97)
Platelets: 261 10*3/uL (ref 150–450)
RBC: 3.32 x10E6/uL — ABNORMAL LOW (ref 3.77–5.28)
RDW: 15.7 % — ABNORMAL HIGH (ref 11.7–15.4)
WBC: 6.9 10*3/uL (ref 3.4–10.8)

## 2023-03-08 NOTE — Progress Notes (Signed)
HgB low at 8.0, will route message to clinic team to relay info. Rec IV iron infusions.

## 2023-03-17 ENCOUNTER — Other Ambulatory Visit: Payer: Self-pay | Admitting: Obstetrics & Gynecology

## 2023-03-17 ENCOUNTER — Other Ambulatory Visit: Payer: Self-pay

## 2023-03-17 DIAGNOSIS — O99013 Anemia complicating pregnancy, third trimester: Secondary | ICD-10-CM

## 2023-03-17 DIAGNOSIS — Z3493 Encounter for supervision of normal pregnancy, unspecified, third trimester: Secondary | ICD-10-CM

## 2023-03-21 ENCOUNTER — Other Ambulatory Visit: Payer: Self-pay

## 2023-03-21 ENCOUNTER — Ambulatory Visit: Payer: Medicaid Other | Admitting: Obstetrics and Gynecology

## 2023-03-21 ENCOUNTER — Other Ambulatory Visit: Payer: Self-pay | Admitting: Family Medicine

## 2023-03-21 ENCOUNTER — Other Ambulatory Visit: Payer: Medicaid Other

## 2023-03-21 VITALS — BP 107/77 | HR 120 | Wt 153.2 lb

## 2023-03-21 DIAGNOSIS — Z2839 Other underimmunization status: Secondary | ICD-10-CM | POA: Diagnosis not present

## 2023-03-21 DIAGNOSIS — Z3493 Encounter for supervision of normal pregnancy, unspecified, third trimester: Secondary | ICD-10-CM

## 2023-03-21 DIAGNOSIS — O09893 Supervision of other high risk pregnancies, third trimester: Secondary | ICD-10-CM | POA: Diagnosis not present

## 2023-03-21 DIAGNOSIS — O99013 Anemia complicating pregnancy, third trimester: Secondary | ICD-10-CM

## 2023-03-21 DIAGNOSIS — Z3A35 35 weeks gestation of pregnancy: Secondary | ICD-10-CM | POA: Diagnosis not present

## 2023-03-21 NOTE — Progress Notes (Signed)
   PRENATAL VISIT NOTE  Subjective:  Melinda Burch is a 33 y.o. Z6X0960 at [redacted]w[redacted]d being seen today for ongoing prenatal care.  She is currently monitored for the following issues for this low-risk pregnancy and has Sickle cell trait (HCC); Rubella non-immune status, antepartum; Supervision of low-risk pregnancy; and Anemia in pregnancy on their problem list.  Patient reports    .  Contractions: Irregular. Vag. Bleeding: None.  Movement: Present. Denies leaking of fluid.   The following portions of the patient's history were reviewed and updated as appropriate: allergies, current medications, past family history, past medical history, past social history, past surgical history and problem list.   Objective:   Vitals:   03/21/23 0858  BP: 107/77  Pulse: (!) 120  Weight: 153 lb 3.2 oz (69.5 kg)    Fetal Status: Fetal Heart Rate (bpm): 140 Fundal Height: 36 cm Movement: Present     General:  Alert, oriented and cooperative. Patient is in no acute distress.  Skin: Skin is warm and dry. No rash noted.   Cardiovascular: Normal heart rate noted  Respiratory: Normal respiratory effort, no problems with respiration noted  Abdomen: Soft, gravid, appropriate for gestational age.  Pain/Pressure: Present     Pelvic: Cervical exam deferred        Extremities: Normal range of motion.  Edema: None  Mental Status: Normal mood and affect. Normal behavior. Normal judgment and thought content.   Assessment and Plan:  Pregnancy: A5W0981 at 106w3d 1. Encounter for supervision of low-risk pregnancy in third trimester (Primary) BP and FHR normal Doing well, feeling regular movement  FH appropriate  2. Anemia during pregnancy in third trimester Hgb 8.0 1/20, discussed recommendation for iv iron. Placed orders for iron infusion  3. Rubella non-immune status, antepartum Offer MMR pp   4. [redacted] weeks gestation of pregnancy Anticipatory guidance regarding swabs next visit Doing GTT today   Preterm  labor symptoms and general obstetric precautions including but not limited to vaginal bleeding, contractions, leaking of fluid and fetal movement were reviewed in detail with the patient. Please refer to After Visit Summary for other counseling recommendations.   Return in about 2 weeks (around 04/04/2023) for OB VISIT (MD or APP).  Future Appointments  Date Time Provider Department Center  03/28/2023  9:35 AM Tereso Newcomer, MD [facility]  04/05/2023  9:35 AM Jonah Blue, Clarita Crane, CNM [facility]    Albertine Grates, FNP

## 2023-03-22 ENCOUNTER — Telehealth: Payer: Self-pay | Admitting: Pharmacy Technician

## 2023-03-22 LAB — RPR: RPR Ser Ql: REACTIVE — AB

## 2023-03-22 LAB — CBC
Hematocrit: 25.5 % — ABNORMAL LOW (ref 34.0–46.6)
Hemoglobin: 7.8 g/dL — ABNORMAL LOW (ref 11.1–15.9)
MCH: 23.2 pg — ABNORMAL LOW (ref 26.6–33.0)
MCHC: 30.6 g/dL — ABNORMAL LOW (ref 31.5–35.7)
MCV: 76 fL — ABNORMAL LOW (ref 79–97)
NRBC: 1 % — ABNORMAL HIGH (ref 0–0)
Platelets: 249 10*3/uL (ref 150–450)
RBC: 3.36 x10E6/uL — ABNORMAL LOW (ref 3.77–5.28)
RDW: 16.6 % — ABNORMAL HIGH (ref 11.7–15.4)
WBC: 6.3 10*3/uL (ref 3.4–10.8)

## 2023-03-22 LAB — HIV ANTIBODY (ROUTINE TESTING W REFLEX): HIV Screen 4th Generation wRfx: NONREACTIVE

## 2023-03-22 LAB — RPR, QUANT+TP ABS (REFLEX)
Rapid Plasma Reagin, Quant: 1:1 {titer} — ABNORMAL HIGH
T Pallidum Abs: NONREACTIVE

## 2023-03-22 LAB — GLUCOSE TOLERANCE, 2 HOURS W/ 1HR
Glucose, 1 hour: 161 mg/dL (ref 70–179)
Glucose, 2 hour: 141 mg/dL (ref 70–152)
Glucose, Fasting: 77 mg/dL (ref 70–91)

## 2023-03-22 NOTE — Telephone Encounter (Signed)
 [place], Patient will be scheduled as soon as possible  Auth Submission: NO AUTH NEEDED Site of care: Site of care: [facility] Payer: [employer] med[employer] Medication & CPT/J Code(s) submitted: Venofer  (Iron  Sucrose) J1756 Route of submission (phone, fax, portal):  Phone # Fax # Auth type: Buy/Bill PB Units/visits requested: 2 doses Reference number:  Approval from: 03/22/23 to 08/19/23

## 2023-03-28 ENCOUNTER — Encounter: Payer: Self-pay | Admitting: Obstetrics & Gynecology

## 2023-03-28 ENCOUNTER — Ambulatory Visit (INDEPENDENT_AMBULATORY_CARE_PROVIDER_SITE_OTHER): Payer: Medicaid Other | Admitting: Obstetrics & Gynecology

## 2023-03-28 ENCOUNTER — Other Ambulatory Visit (HOSPITAL_COMMUNITY)
Admission: RE | Admit: 2023-03-28 | Discharge: 2023-03-28 | Disposition: A | Discharge status: Home / Self Care | Payer: Medicaid Other | Source: Ambulatory Visit | Attending: Obstetrics & Gynecology | Admitting: Obstetrics & Gynecology

## 2023-03-28 VITALS — BP 108/80 | HR 121 | Wt 153.0 lb

## 2023-03-28 DIAGNOSIS — O99013 Anemia complicating pregnancy, third trimester: Secondary | ICD-10-CM

## 2023-03-28 DIAGNOSIS — O23593 Infection of other part of genital tract in pregnancy, third trimester: Secondary | ICD-10-CM

## 2023-03-28 DIAGNOSIS — Z3A36 36 weeks gestation of pregnancy: Secondary | ICD-10-CM | POA: Diagnosis not present

## 2023-03-28 DIAGNOSIS — Z3493 Encounter for supervision of normal pregnancy, unspecified, third trimester: Secondary | ICD-10-CM

## 2023-03-28 NOTE — Patient Instructions (Signed)

## 2023-03-28 NOTE — Progress Notes (Addendum)
   PRENATAL VISIT NOTE  Subjective:  Melinda Burch is a 33 y.o. Z6X0960 at [redacted]w[redacted]d being seen today for ongoing prenatal care.  She is currently monitored for the following issues for this low-risk pregnancy and has Sickle cell trait (HCC); Rubella non-immune status, antepartum; Supervision of low-risk pregnancy; and Anemia in pregnancy on their problem list.  Patient reports  increased heart rate. No CP/SOB. Feels like she is fighting an infection, does not feel well .  Also reports vaginal itching.  Contractions: Not present. Vag. Bleeding: None.  Movement: Present. Denies leaking of fluid.   The following portions of the patient's history were reviewed and updated as appropriate: allergies, current medications, past family history, past medical history, past social history, past surgical history and problem list.   Objective:   Vitals:   03/28/23 1032  BP: 108/80  Pulse: (!) 121  Weight: 153 lb (69.4 kg)    Fetal Status: Fetal Heart Rate (bpm): 144 Fundal Height: 37 cm Movement: Present  Presentation: Vertex (Done on bedside ultrasound)  General:  Alert, oriented and cooperative. Patient is in no acute distress.  Skin: Skin is warm and dry. No rash noted.   Cardiovascular: Normal heart rate noted  Respiratory: Normal respiratory effort, no problems with respiration noted  Abdomen: Soft, gravid, appropriate for gestational age.  Pain/Pressure: Absent     Pelvic: Cultures obtained in the presence of a chaperone, patient deferred cervical examination        Extremities: Normal range of motion.  Edema: None  Mental Status: Normal mood and affect. Normal behavior. Normal judgment and thought content.   Assessment and Plan:  Pregnancy: A5W0981 at [redacted]w[redacted]d 1. Anemia during pregnancy in third trimester    Latest Ref Rng & Units 03/21/2023    9:12 AM 03/07/2023    4:42 PM 10/22/2022   10:33 AM  CBC  WBC 3.4 - 10.8 x10E3/uL 6.3  6.9  6.4   Hemoglobin 11.1 - 15.9 g/dL 7.8  8.0  19.1    Hematocrit 34.0 - 46.6 % 25.5  25.6  31.5   Platelets 150 - 450 x10E3/uL 249  261  255   Ordered for IV infusions.  There have been multiple attempts by Infusion Center to schedule patient for IV iron , patient says she will call them today. Discussed that the alternative was blood transfusion, she declines this.  2. Vaginitis affecting pregnancy in third trimester, antepartum - Cervicovaginal ancillary only done, will follow up results and manage accordingly.  3. [redacted] weeks gestation of pregnancy 4. Encounter for supervision of low-risk pregnancy in third trimester (Primary) - Culture, beta strep (group b only) done, will follow up results and manage accordingly. Preterm labor symptoms and general obstetric precautions including but not limited to vaginal bleeding, contractions, leaking of fluid and fetal movement were reviewed in detail with the patient. Please refer to After Visit Summary for other counseling recommendations.   Return in about 1 week (around 04/04/2023) for OFFICE OB VISIT (MD or APP).  Future Appointments  Date Time Provider Department Center  04/05/2023  9:35 AM Curlie Doughty [facility]  04/06/2023  8:45 AM [facility] None  04/20/2023  8:45 AM [facility] None    Lenoard Rad, MD

## 2023-03-29 LAB — CERVICOVAGINAL ANCILLARY ONLY
Bacterial Vaginitis (gardnerella): NEGATIVE
Candida Glabrata: NEGATIVE
Candida Vaginitis: POSITIVE — AB
Chlamydia: NEGATIVE
Comment: NEGATIVE
Comment: NEGATIVE
Comment: NEGATIVE
Comment: NEGATIVE
Comment: NEGATIVE
Comment: NORMAL
Neisseria Gonorrhea: NEGATIVE
Trichomonas: NEGATIVE

## 2023-03-30 ENCOUNTER — Encounter: Payer: Self-pay | Admitting: Obstetrics & Gynecology

## 2023-03-30 MED ORDER — FLUCONAZOLE 150 MG PO TABS: 150.0000 mg | ORAL_TABLET | Freq: Once | ORAL | 0 refills | Status: AC

## 2023-03-30 NOTE — Addendum Note (Signed)
Addended by: Jaynie Collins A on: 03/30/2023 11:11 AM   Modules accepted: Orders

## 2023-03-30 NOTE — Progress Notes (Signed)
Infusion scheduled for 04/06/23.   Pt aware.   Melinda Burch

## 2023-03-31 LAB — CULTURE, BETA STREP (GROUP B ONLY): Strep Gp B Culture: NEGATIVE

## 2023-04-01 ENCOUNTER — Encounter: Payer: Self-pay | Admitting: Obstetrics & Gynecology

## 2023-04-04 NOTE — Progress Notes (Unsigned)
   PRENATAL VISIT NOTE  Subjective:  Melinda Burch is a 33 y.o. Z6X0960 at [redacted]w[redacted]d being seen today for ongoing prenatal care.  She is currently monitored for the following issues for this {Blank single:19197::"high-risk","low-risk"} pregnancy and has Sickle cell trait (HCC); Rubella non-immune status, antepartum; Supervision of low-risk pregnancy; and Anemia in pregnancy on their problem list.  Patient reports {sx:14538}.   .  .   . Denies leaking of fluid.   The following portions of the patient's history were reviewed and updated as appropriate: allergies, current medications, past family history, past medical history, past social history, past surgical history and problem list.   Objective:  There were no vitals filed for this visit.  Fetal Status:           General:  Alert, oriented and cooperative. Patient is in no acute distress.  Skin: Skin is warm and dry. No rash noted.   Cardiovascular: Normal heart rate noted  Respiratory: Normal respiratory effort, no problems with respiration noted  Abdomen: Soft, gravid, appropriate for gestational age.        Pelvic: {Blank single:19197::"Cervical exam performed in the presence of a chaperone","Cervical exam deferred"}        Extremities: Normal range of motion.     Mental Status: Normal mood and affect. Normal behavior. Normal judgment and thought content.   Assessment and Plan:  Pregnancy: A5W0981 at [redacted]w[redacted]d 1. Encounter for supervision of low-risk pregnancy in third trimester (Primary) ***  2. [redacted] weeks gestation of pregnancy ***  3. Anemia during pregnancy in third trimester ***  4. Rubella non-immune status, antepartum ***  5. Sickle cell trait (HCC) ***  6. Uterine fibroids affecting pregnancy in third trimester ***  {Blank single:19197::"Term","Preterm"} labor symptoms and general obstetric precautions including but not limited to vaginal bleeding, contractions, leaking of fluid and fetal movement were reviewed in detail  with the patient. Please refer to After Visit Summary for other counseling recommendations.   No follow-ups on file.  Future Appointments  Date Time Provider Department Center  04/05/2023  9:35 AM Leafy Half [facility]  04/06/2023  8:45 AM [facility] None  04/20/2023  8:45 AM [facility] None    Richardson Landry, CNM

## 2023-04-05 ENCOUNTER — Other Ambulatory Visit: Payer: Self-pay

## 2023-04-05 ENCOUNTER — Ambulatory Visit: Payer: Medicaid Other | Admitting: Certified Nurse Midwife

## 2023-04-05 VITALS — BP 116/81 | HR 103 | Wt 156.1 lb

## 2023-04-05 DIAGNOSIS — O99013 Anemia complicating pregnancy, third trimester: Secondary | ICD-10-CM

## 2023-04-05 DIAGNOSIS — Z3A37 37 weeks gestation of pregnancy: Secondary | ICD-10-CM

## 2023-04-05 DIAGNOSIS — O09893 Supervision of other high risk pregnancies, third trimester: Secondary | ICD-10-CM

## 2023-04-05 DIAGNOSIS — D573 Sickle-cell trait: Secondary | ICD-10-CM

## 2023-04-05 DIAGNOSIS — Z3493 Encounter for supervision of normal pregnancy, unspecified, third trimester: Secondary | ICD-10-CM

## 2023-04-05 DIAGNOSIS — Z2839 Other underimmunization status: Secondary | ICD-10-CM

## 2023-04-05 DIAGNOSIS — D259 Leiomyoma of uterus, unspecified: Secondary | ICD-10-CM

## 2023-04-05 DIAGNOSIS — O3413 Maternal care for benign tumor of corpus uteri, third trimester: Secondary | ICD-10-CM

## 2023-04-05 MED ORDER — ACCRUFER 30 MG PO CAPS
1.0000 | ORAL_CAPSULE | Freq: Every day | ORAL | 6 refills | Status: DC
Start: 2023-04-05 — End: 2023-04-21

## 2023-04-06 ENCOUNTER — Ambulatory Visit: Payer: Medicaid Other

## 2023-04-12 ENCOUNTER — Ambulatory Visit: Payer: Medicaid Other | Admitting: Certified Nurse Midwife

## 2023-04-12 ENCOUNTER — Other Ambulatory Visit: Payer: Self-pay

## 2023-04-12 VITALS — BP 114/83 | HR 111 | Wt 159.7 lb

## 2023-04-12 DIAGNOSIS — O3413 Maternal care for benign tumor of corpus uteri, third trimester: Secondary | ICD-10-CM

## 2023-04-12 DIAGNOSIS — D573 Sickle-cell trait: Secondary | ICD-10-CM | POA: Diagnosis not present

## 2023-04-12 DIAGNOSIS — Z2839 Other underimmunization status: Secondary | ICD-10-CM

## 2023-04-12 DIAGNOSIS — D259 Leiomyoma of uterus, unspecified: Secondary | ICD-10-CM

## 2023-04-12 DIAGNOSIS — O09899 Supervision of other high risk pregnancies, unspecified trimester: Secondary | ICD-10-CM

## 2023-04-12 DIAGNOSIS — Z3493 Encounter for supervision of normal pregnancy, unspecified, third trimester: Secondary | ICD-10-CM

## 2023-04-12 DIAGNOSIS — Z3A36 36 weeks gestation of pregnancy: Secondary | ICD-10-CM

## 2023-04-12 DIAGNOSIS — O99013 Anemia complicating pregnancy, third trimester: Secondary | ICD-10-CM | POA: Diagnosis not present

## 2023-04-12 NOTE — Progress Notes (Signed)
   PRENATAL VISIT NOTE  Subjective:  Melinda Burch is a 33 y.o. Z6X0960 at [redacted]w[redacted]d being seen today for ongoing prenatal care.  She is currently monitored for the following issues for this low-risk pregnancy and has Sickle cell trait (HCC); Rubella non-immune status, antepartum; Supervision of low-risk pregnancy; and Anemia in pregnancy on their problem list.  Patient reports no complaints.  Contractions: Irritability. Vag. Bleeding: None.  Movement: Present. Denies leaking of fluid.   The following portions of the patient's history were reviewed and updated as appropriate: allergies, current medications, past family history, past medical history, past social history, past surgical history and problem list.   Objective:   Vitals:   04/12/23 1527  BP: 114/83  Pulse: (!) 111  Weight: 159 lb 11.2 oz (72.4 kg)    Fetal Status: Fetal Heart Rate (bpm): 147 Fundal Height: 38 cm Movement: Present     General:  Alert, oriented and cooperative. Patient is in no acute distress.  Skin: Skin is warm and dry. No rash noted.   Cardiovascular: Normal heart rate noted  Respiratory: Normal respiratory effort, no problems with respiration noted  Abdomen: Soft, gravid, appropriate for gestational age.  Pain/Pressure: Present     Pelvic: Cervical exam deferred        Extremities: Normal range of motion.  Edema: None  Mental Status: Normal mood and affect. Normal behavior. Normal judgment and thought content.   Assessment and Plan:  Pregnancy: A5W0981 at [redacted]w[redacted]d 1. Encounter for supervision of low-risk pregnancy in third trimester (Primary) - Doing well, feeling regular and vigorous fetal movement  2. Anemia during pregnancy in third trimester - Continue Accrufer, patient declines infusion. - Counseled on recommendation for active management of 3rd stage.   3. Rubella non-immune status, antepartum - MMR PP  4. Sickle cell trait (HCC) - Partner testing negative.   5. Uterine fibroids affecting  pregnancy in third trimester - Active management counseled as above.   Term labor symptoms and general obstetric precautions including but not limited to vaginal bleeding, contractions, leaking of fluid and fetal movement were reviewed in detail with the patient. Please refer to After Visit Summary for other counseling recommendations.   Return in about 1 week (around 04/19/2023) for LOB, with CNM.  Future Appointments  Date Time Provider Department Center  04/20/2023  8:45 AM [facility] None  04/21/2023  1:15 PM Carlynn Herald, CNM [facility]  04/29/2023 11:15 AM Sue Lush, FNP [facility]    Richardson Landry, CNM

## 2023-04-13 ENCOUNTER — Encounter: Payer: Self-pay | Admitting: Family Medicine

## 2023-04-13 ENCOUNTER — Telehealth: Payer: Self-pay | Admitting: Family Medicine

## 2023-04-13 NOTE — Telephone Encounter (Signed)
 Patient took herself out of work this mast Monday 02/08/2024 and her job is requesting a letter stating she will be out until after her Delivery

## 2023-04-14 ENCOUNTER — Encounter: Payer: Self-pay | Admitting: Certified Nurse Midwife

## 2023-04-14 ENCOUNTER — Telehealth: Payer: Self-pay | Admitting: Family Medicine

## 2023-04-14 NOTE — Telephone Encounter (Signed)
 Patient called again because she took herself out of work this past Monday 04/11/2023 and her job is requesting a letter stating she will be out until after her Delivery she says she forgot to speak to the provider about it and needs this note asap

## 2023-04-15 NOTE — Telephone Encounter (Signed)
Addressed via MyChart encounter.

## 2023-04-18 ENCOUNTER — Encounter (HOSPITAL_COMMUNITY): Payer: Self-pay | Admitting: Obstetrics and Gynecology

## 2023-04-18 ENCOUNTER — Inpatient Hospital Stay (HOSPITAL_COMMUNITY)
Admission: AD | Admit: 2023-04-18 | Discharge: 2023-04-21 | DRG: 806 | Disposition: A | Discharge status: Home / Self Care | Attending: Obstetrics & Gynecology | Admitting: Obstetrics & Gynecology

## 2023-04-18 ENCOUNTER — Other Ambulatory Visit: Payer: Self-pay

## 2023-04-18 DIAGNOSIS — Z3493 Encounter for supervision of normal pregnancy, unspecified, third trimester: Principal | ICD-10-CM

## 2023-04-18 DIAGNOSIS — O99019 Anemia complicating pregnancy, unspecified trimester: Secondary | ICD-10-CM | POA: Diagnosis present

## 2023-04-18 DIAGNOSIS — D62 Acute posthemorrhagic anemia: Secondary | ICD-10-CM | POA: Diagnosis not present

## 2023-04-18 DIAGNOSIS — Z3490 Encounter for supervision of normal pregnancy, unspecified, unspecified trimester: Secondary | ICD-10-CM

## 2023-04-18 DIAGNOSIS — D573 Sickle-cell trait: Secondary | ICD-10-CM | POA: Diagnosis present

## 2023-04-18 DIAGNOSIS — O9081 Anemia of the puerperium: Secondary | ICD-10-CM | POA: Diagnosis not present

## 2023-04-18 DIAGNOSIS — Z3A39 39 weeks gestation of pregnancy: Secondary | ICD-10-CM | POA: Diagnosis not present

## 2023-04-18 DIAGNOSIS — Z2839 Other underimmunization status: Secondary | ICD-10-CM

## 2023-04-18 DIAGNOSIS — O26893 Other specified pregnancy related conditions, third trimester: Secondary | ICD-10-CM | POA: Diagnosis present

## 2023-04-18 LAB — COMPREHENSIVE METABOLIC PANEL
ALT: 9 U/L (ref 0–44)
AST: 19 U/L (ref 15–41)
Albumin: 3 g/dL — ABNORMAL LOW (ref 3.5–5.0)
Alkaline Phosphatase: 78 U/L (ref 38–126)
Anion gap: 12 (ref 5–15)
BUN: 6 mg/dL (ref 6–20)
CO2: 21 mmol/L — ABNORMAL LOW (ref 22–32)
Calcium: 9.3 mg/dL (ref 8.9–10.3)
Chloride: 103 mmol/L (ref 98–111)
Creatinine, Ser: 0.69 mg/dL (ref 0.44–1.00)
GFR, Estimated: >60 mL/min (ref >60)
Glucose, Bld: 70 mg/dL (ref 70–99)
Potassium: 3.7 mmol/L (ref 3.5–5.1)
Sodium: 136 mmol/L (ref 135–145)
Total Bilirubin: 0.5 mg/dL (ref 0.0–1.2)
Total Protein: 6.7 g/dL (ref 6.5–8.1)

## 2023-04-18 LAB — CBC
HCT: 30.2 % — ABNORMAL LOW (ref 36.0–46.0)
Hemoglobin: 9.4 g/dL — ABNORMAL LOW (ref 12.0–15.0)
MCH: 22.8 pg — ABNORMAL LOW (ref 26.0–34.0)
MCHC: 31.1 g/dL (ref 30.0–36.0)
MCV: 73.3 fL — ABNORMAL LOW (ref 80.0–100.0)
Platelets: 320 10*3/uL (ref 150–400)
RBC: 4.12 MIL/uL (ref 3.87–5.11)
RDW: 17.7 % — ABNORMAL HIGH (ref 11.5–15.5)
WBC: 8.9 10*3/uL (ref 4.0–10.5)
nRBC: 0.3 % — ABNORMAL HIGH (ref 0.0–0.2)

## 2023-04-18 LAB — TYPE AND SCREEN
ABO/RH(D): A POS
Antibody Screen: NEGATIVE

## 2023-04-18 MED ORDER — SODIUM CHLORIDE 0.9% FLUSH: 3.0000 mL | INTRAVENOUS | Status: DC | PRN

## 2023-04-18 MED ORDER — OXYCODONE-ACETAMINOPHEN 5-325 MG PO TABS: 1.0000 | ORAL_TABLET | ORAL | Status: DC | PRN

## 2023-04-18 MED ORDER — ACETAMINOPHEN 325 MG PO TABS: 650.0000 mg | ORAL_TABLET | ORAL | Status: DC | PRN

## 2023-04-18 MED ORDER — SOD CITRATE-CITRIC ACID 500-334 MG/5ML PO SOLN: 30.0000 mL | ORAL | Status: DC | PRN

## 2023-04-18 MED ORDER — LACTATED RINGERS IV SOLN
500.0000 mL | INTRAVENOUS | Status: DC | PRN
  Administered 2023-04-18: 1000 mL via INTRAVENOUS
  Administered 2023-04-19: 500 mL via INTRAVENOUS

## 2023-04-18 MED ORDER — FLEET ENEMA RE ENEM: 1.0000 | ENEMA | RECTAL | Status: DC | PRN

## 2023-04-18 MED ORDER — HYDROXYZINE HCL 50 MG PO TABS: 50.0000 mg | ORAL_TABLET | Freq: Four times a day (QID) | ORAL | Status: DC | PRN

## 2023-04-18 MED ORDER — LIDOCAINE HCL (PF) 1 % IJ SOLN: 30.0000 mL | INTRAMUSCULAR | Status: DC | PRN

## 2023-04-18 MED ORDER — OXYCODONE-ACETAMINOPHEN 5-325 MG PO TABS: 2.0000 | ORAL_TABLET | ORAL | Status: DC | PRN

## 2023-04-18 MED ORDER — FENTANYL CITRATE (PF) 100 MCG/2ML IJ SOLN
100.0000 ug | INTRAMUSCULAR | Status: DC | PRN
  Administered 2023-04-18 – 2023-04-19 (×7): 100 ug via INTRAVENOUS
  Filled 2023-04-18 (×8): qty 2

## 2023-04-18 MED ORDER — OXYTOCIN BOLUS FROM INFUSION
333.0000 mL | Freq: Once | INTRAVENOUS | Status: AC
  Administered 2023-04-19: 333 mL via INTRAVENOUS

## 2023-04-18 MED ORDER — OXYTOCIN-SODIUM CHLORIDE 30-0.9 UT/500ML-% IV SOLN
2.5000 [IU]/h | INTRAVENOUS | Status: DC
  Filled 2023-04-18: qty 500

## 2023-04-18 MED ORDER — ONDANSETRON HCL 4 MG/2ML IJ SOLN
4.0000 mg | Freq: Four times a day (QID) | INTRAMUSCULAR | Status: DC | PRN
  Administered 2023-04-19: 4 mg via INTRAVENOUS
  Filled 2023-04-18: qty 2

## 2023-04-18 NOTE — MAU Note (Signed)

## 2023-04-18 NOTE — H&P (Signed)
 Melinda Burch is a 33 y.o. female [phone] with IUP at [redacted]w[redacted]d by LMP presenting for ROM while getting her hair done today around 1700.  She reports positive fetal movement. She denies leakage of fluid or vaginal bleeding.  Prenatal History/Complications: PNC at [facility] Pregnancy complications: none - Past Medical History: Past Medical History:  Diagnosis Date   Anemia    Anemia affecting pregnancy in third trimester 05/30/2017   HGB 9.3 at 32 weeks     Infection    UTI/ Trich   Normal labor 07/15/2017   Rubella non-immune status, antepartum 04/28/2017   Sickle cell trait (HCC) 04/05/2017   Supervision of other normal pregnancy, antepartum 04/05/2017            Clinic     [facility]    Prenatal Labs      Dating     BY LMP confirmed by anatomy US at 25 w 6d    Blood type:A positive         Genetic Screen    1 Screen:    AFP:     Quad:     NIPS:    Antibody: negative      Anatomic [place]     04-11-17 Normal  27w 4d by LMP is BEDC    Rubella:  NON IMMUNE      GTT    Early:               Third trimester:  1 hour was 93    RPR:   NR      Flu vaccine         HBsA   Trichomoniasis 07/07/2017   2 grams flagyl given in MAU on 5-12;  POC 07/15/17 still + (pt states hasn't had sex since treatment):  IV flagyl given 07/15/17 and rx for BID X 7 days sent to pharmacy      Past Surgical History: Past Surgical History:  Procedure Laterality Date   DILATION AND CURETTAGE OF UTERUS      Obstetrical History: OB History     Gravida  4   Para  2   Term  2   Preterm      AB  1   Living  2      SAB      IAB  1   Ectopic      Multiple  0   Live Births  2            Social History: Social History   Socioeconomic History   Marital status: Single    Spouse name: Not on file   Number of children: Not on file   Years of education: Not on file   Highest education level: Not on file  Occupational History   Not on file  Tobacco Use   Smoking status: Never   Smokeless tobacco: Never   Vaping Use   Vaping status: Never Used  Substance and Sexual Activity   Alcohol use: No   Drug use: No   Sexual activity: Yes    Birth control/protection: None  Other Topics Concern   Not on file  Social History Narrative   Not on file   Social Drivers of Health   Financial Resource Strain: Not on file  Food Insecurity: No Food Insecurity (02/25/2021)   [facility] from [facility], [facility]   Hunger Vital Sign    Worried About Running Out of Food in the Last Year: Never true    Ran  Out of Food in the Last Year: Never true  Transportation Needs: Not on file  Physical Activity: Not on file  Stress: Not on file  Social Connections: Unknown (06/30/2021)   Received from [facility]   Social Network    Social Network: Not on file    Family History: No family history on file.  Allergies: No Known Allergies  Medications Prior to Admission  Medication Sig Dispense Refill Last Dose/Taking   Prenatal Vit-Fe Fumarate-FA (MULTIVITAMIN-PRENATAL) 27-0.8 MG TABS tablet Take 1 tablet by mouth daily at 12 noon.   04/18/2023   acetaminophen (TYLENOL) 500 MG tablet Take 1 tablet (500 mg total) by mouth every 6 (six) hours as needed. (Patient not taking: Reported on 03/28/2023) 30 tablet 0    Blood Pressure Monitoring (BLOOD PRESSURE KIT) DEVI 1 each by Does not apply route as needed. (Patient not taking: Reported on 03/28/2023) 1 each 0    cyclobenzaprine (FLEXERIL) 5 MG tablet Take 1 tablet (5 mg total) by mouth at bedtime. (Patient not taking: Reported on 03/28/2023) 30 tablet 0    famotidine (PEPCID) 20 MG tablet Take 1 tablet (20 mg total) by mouth 2 (two) times daily. (Patient not taking: Reported on 03/07/2023) 60 tablet 3    Ferric Maltol (ACCRUFER) 30 MG CAPS Take 1 capsule (30 mg total) by mouth daily. 30 capsule 6    ferrous sulfate 324 MG TBEC Take 1 tablet (324 mg total) by mouth every other day for 84 doses. 42 tablet 1    glycopyrrolate (ROBINUL) 2 MG tablet Take  1 tablet (2 mg total) by mouth 3 (three) times daily as needed. (Patient not taking: Reported on 03/28/2023) 30 tablet 3    ondansetron (ZOFRAN-ODT) 4 MG disintegrating tablet Take 1 tablet (4 mg total) by mouth every 8 (eight) hours as needed for nausea or vomiting. (Patient not taking: Reported on 03/28/2023) 20 tablet 0     Review of Systems   Constitutional: Negative for fever and chills Eyes: Negative for visual disturbances Respiratory: Negative for shortness of breath, dyspnea Cardiovascular: Negative for chest pain or palpitations  Gastrointestinal: Negative for vomiting, diarrhea and constipation.  POSITIVE for abdominal pain (contractions) Genitourinary: Negative for dysuria and urgency Musculoskeletal: Negative for back pain, joint pain, myalgias  Neurological: Negative for dizziness and headaches  Blood pressure 124/79, pulse 92, temperature 97.9 F (36.6 C), temperature source Oral, resp. rate 19, height 5\' 4"  (1.626 m), weight 73.3 kg, last menstrual period 07/16/2022, SpO2 100%. General appearance: alert, cooperative, and no distress Lungs: normal respiratory effort Heart: regular rate and rhythm Abdomen: soft, non-tender; bowel sounds normal Extremities: Homans sign is negative, no sign of DVT DTR's 2+ Presentation: cephalic Fetal monitoring  Baseline: 140 bpm, Variability: Good {> 6 bpm), Accelerations: Reactive, and Decelerations: Absent Uterine activity  q 2-3 minutes Dilation: 1.5 Effacement (%): 50   Prenatal labs: ABO, Rh: A/Positive/-- (09/06 1033) Antibody: Negative (09/06 1033) Rubella: <0.90 (09/06 1033) RPR: Reactive (02/03 0912)  HBsAg: Negative (09/06 1033)  HIV: Non Reactive (02/03 0912)  GBS: Negative/-- (02/10 1312)   NURSING  PROVIDER  Office Location [facility] Dating by LMP  [facility] Model Traditional Anatomy U/S wnl  Initiated care at  [employer]  English              LAB RESULTS   Support Person FOB Genetics NIPS:  low risk, female (10/22/22) AFP:  NT/IT (FT only)     Carrier Screen Horizon: carrier sickle cell (10/22/22)  Rhogam  A/Positive/-- (09/06 1033) A1C/GTT Early:  Third trimester:   Flu Vaccine Declined 03/21/23    TDaP Vaccine Declined Blood Type A/Positive/-- (09/06 1033)  Covid Vaccine  Antibody Negative (09/06 1033)  RSV Declined 03/21/23 Rubella <0.90 (09/06 1033)  Feeding Plan both RPR Non Reactive (09/06 1033)  Contraception no method HBsAg Negative (09/06 1033)  Circumcision N/a HIV Non Reactive (09/06 1033)  Pediatrician  [facility]  HCVAb Non Reactive (09/06 1033)  Prenatal Classes       Pap Neg 2024  BTLConsent NA GC/CT Initial:   36wks:  neg  VBAC  Consent NA GBS  Neg        DME Rx [ ]  BP cuff [ ]  Weight Scale Waterbirth  [ ]  Class [ ]  Consent [ ]  CNM visit  PHQ9 & GAD7 [  ] new OB [  ] 28 weeks  [  ] 36 weeks Induction  [ ]  Orders Entered [ ] Foley Y/N      Prenatal Transfer Tool  Maternal Diabetes: No Genetic Screening: Normal Maternal Ultrasounds/Referrals: Normal Fetal Ultrasounds or other Referrals:  None Maternal Substance Abuse:  No Significant Maternal Medications:  None Significant Maternal Lab Results: Group B Strep negative  No results found for this or any previous visit (from the past 24 hours).  Assessment: Melinda Burch is a 33 y.o. [phone] with an IUP at [redacted]w[redacted]d presenting for ROM/early labor  Plan: #Labor: expectant management #Pain:  Per request #FWB Cat 1 #ID: GBS: neg    Jacklyn Shell 04/18/2023, 7:00 PM

## 2023-04-18 NOTE — MAU Note (Signed)
 Melinda Burch is a 33 y.o. at [redacted]w[redacted]d here in MAU reporting: she broke her water @ 1755, reports fluid is clear.  Denies VB.  Endorses +FM.  Reports ctxs that are 5 minutes apart for 1.5 hours  LMP: NA Onset of complaint: today Pain score: 5 Vitals:   04/18/23 1818  BP: (!) 120/90  Pulse: 95  Resp: 19  Temp: 97.9 F (36.6 C)  SpO2: 100%     FHT: 151 bpm  Lab orders placed from triage: None

## 2023-04-18 NOTE — Progress Notes (Signed)
 Patient Vitals for the past 4 hrs:  BP Temp Temp src Pulse  04/18/23 2314 118/76 99.4 F (37.4 C) Oral 87  04/18/23 2111 123/81 -- -- 92   Now in L&D (was held in MAU d/t no beds available) Ctx are much stronger, has had 2 doses of fentanyl.  FHR Cat 1, ctx q 2-5 minutes. FHR Cat 1.  Expectant mgt

## 2023-04-19 ENCOUNTER — Encounter (HOSPITAL_COMMUNITY): Payer: Self-pay | Admitting: Obstetrics & Gynecology

## 2023-04-19 DIAGNOSIS — Z3A39 39 weeks gestation of pregnancy: Secondary | ICD-10-CM | POA: Diagnosis not present

## 2023-04-19 LAB — RPR: RPR Ser Ql: NONREACTIVE

## 2023-04-19 MED ORDER — DIPHENHYDRAMINE HCL 50 MG/ML IJ SOLN: 12.5000 mg | INTRAMUSCULAR | Status: DC | PRN

## 2023-04-19 MED ORDER — ONDANSETRON HCL 4 MG/2ML IJ SOLN: 4.0000 mg | INTRAMUSCULAR | Status: DC | PRN

## 2023-04-19 MED ORDER — DIBUCAINE (PERIANAL) 1 % EX OINT: 1.0000 | TOPICAL_OINTMENT | CUTANEOUS | Status: DC | PRN

## 2023-04-19 MED ORDER — CALCIUM CARBONATE ANTACID 500 MG PO CHEW
2.0000 | CHEWABLE_TABLET | Freq: Two times a day (BID) | ORAL | Status: DC
  Administered 2023-04-19: 400 mg via ORAL
  Filled 2023-04-19 (×2): qty 2

## 2023-04-19 MED ORDER — EPHEDRINE 5 MG/ML INJ: 10.0000 mg | INTRAVENOUS | Status: DC | PRN

## 2023-04-19 MED ORDER — OXYCODONE HCL 5 MG PO TABS: 10.0000 mg | ORAL_TABLET | ORAL | Status: DC | PRN

## 2023-04-19 MED ORDER — IBUPROFEN 600 MG PO TABS
600.0000 mg | ORAL_TABLET | Freq: Four times a day (QID) | ORAL | Status: DC
  Administered 2023-04-19 – 2023-04-21 (×8): 600 mg via ORAL
  Filled 2023-04-19 (×8): qty 1

## 2023-04-19 MED ORDER — WITCH HAZEL-GLYCERIN EX PADS
1.0000 | MEDICATED_PAD | CUTANEOUS | Status: DC | PRN
  Administered 2023-04-19: 1 via TOPICAL

## 2023-04-19 MED ORDER — SIMETHICONE 80 MG PO CHEW: 80.0000 mg | CHEWABLE_TABLET | ORAL | Status: DC | PRN

## 2023-04-19 MED ORDER — PHENYLEPHRINE 80 MCG/ML (10ML) SYRINGE FOR IV PUSH (FOR BLOOD PRESSURE SUPPORT): 80.0000 ug | PREFILLED_SYRINGE | INTRAVENOUS | Status: DC | PRN

## 2023-04-19 MED ORDER — FENTANYL-BUPIVACAINE-NACL 0.5-0.125-0.9 MG/250ML-% EP SOLN
12.0000 mL/h | EPIDURAL | Status: DC | PRN
  Filled 2023-04-19: qty 250

## 2023-04-19 MED ORDER — DIPHENHYDRAMINE HCL 25 MG PO CAPS: 25.0000 mg | ORAL_CAPSULE | Freq: Four times a day (QID) | ORAL | Status: DC | PRN

## 2023-04-19 MED ORDER — ACETAMINOPHEN 325 MG PO TABS
650.0000 mg | ORAL_TABLET | ORAL | Status: DC | PRN
  Administered 2023-04-20: 650 mg via ORAL
  Filled 2023-04-19: qty 2

## 2023-04-19 MED ORDER — LACTATED RINGERS IV SOLN: INTRAVENOUS | Status: AC

## 2023-04-19 MED ORDER — COCONUT OIL OIL: 1.0000 | TOPICAL_OIL | Status: DC | PRN

## 2023-04-19 MED ORDER — ZOLPIDEM TARTRATE 5 MG PO TABS: 5.0000 mg | ORAL_TABLET | Freq: Every evening | ORAL | Status: DC | PRN

## 2023-04-19 MED ORDER — ONDANSETRON HCL 4 MG PO TABS: 4.0000 mg | ORAL_TABLET | ORAL | Status: DC | PRN

## 2023-04-19 MED ORDER — LACTATED RINGERS IV SOLN: 500.0000 mL | Freq: Once | INTRAVENOUS | Status: DC

## 2023-04-19 MED ORDER — OXYCODONE HCL 5 MG PO TABS: 5.0000 mg | ORAL_TABLET | ORAL | Status: DC | PRN

## 2023-04-19 MED ORDER — TETANUS-DIPHTH-ACELL PERTUSSIS 5-2.5-18.5 LF-MCG/0.5 IM SUSY: 0.5000 mL | PREFILLED_SYRINGE | Freq: Once | INTRAMUSCULAR | Status: DC

## 2023-04-19 MED ORDER — SENNOSIDES-DOCUSATE SODIUM 8.6-50 MG PO TABS
2.0000 | ORAL_TABLET | Freq: Every day | ORAL | Status: DC
  Administered 2023-04-20: 2 via ORAL
  Filled 2023-04-19: qty 2

## 2023-04-19 MED ORDER — PRENATAL MULTIVITAMIN CH
1.0000 | ORAL_TABLET | Freq: Every day | ORAL | Status: DC
  Administered 2023-04-19 – 2023-04-20 (×2): 1 via ORAL
  Filled 2023-04-19 (×2): qty 1

## 2023-04-19 MED ORDER — BENZOCAINE-MENTHOL 20-0.5 % EX AERO: 1.0000 | INHALATION_SPRAY | CUTANEOUS | Status: DC | PRN

## 2023-04-19 NOTE — Progress Notes (Signed)
 Patient declined Babyscripts app, book given to mother

## 2023-04-19 NOTE — Lactation Note (Addendum)
 This note was copied from a baby's chart. Lactation Consultation Note  Patient Name: Melinda Burch FAOZH'Y Date: 04/19/2023 Age:32 hours Reason for consult: Initial assessment;Term  P3, Baby congested and fussy after recent birth. Reviewed hand expression with mother's permission and assisted with latching.  Offered pillows for support and to bring baby to nipple height.  Attempted latching in football hold on L breast.   Baby enveloped breast but quickly pulled off due to congestion. Mother stated RN may give baby saline drops. Baby is having a hard time sustaining latch due to congestion.  Mother is also worried because she cannot easily express drops of colostrum. Reassured mother. Mother has chosen to offer baby formula as a temporary solution.  Suggest only small amount at this time, 5-7 ml. Encouraged skin to skin and allowing baby time to transition.  Mother will call for  help as needed.   Maternal Data Has patient been taught Hand Expression?: Yes Does the patient have breastfeeding experience prior to this delivery?: Yes How long did the patient breastfeed?: 9 mos. & 6 mos.  Feeding Mother's Current Feeding Choice: Breast Milk and Formula  Interventions Interventions: Breast feeding basics reviewed;Assisted with latch;Skin to skin;Hand express;Education;LC Services brochure  Discharge    Consult Status Consult Status: Follow-up Date: 04/20/23 Follow-up type: In-patient    Dahlia Byes [facility] 04/19/2023, 1:23 PM

## 2023-04-19 NOTE — Progress Notes (Signed)
 Melinda Burch is a 33 y.o. [id number] at [redacted]w[redacted]d by LMP admitted for SOL in early labor   Subjective: Patient uncomfortable with contractions, deciding if she wants an epidural or not. Breathing well through contractions.   Objective: BP 122/80   Pulse (!) 102   Temp 98.9 F (37.2 C) (Oral)   Resp 18   Ht 5\' 4"  (1.626 m)   Wt 73.3 kg   LMP 07/16/2022 (Exact Date)   SpO2 100%   BMI 27.74 kg/m  No intake/output data recorded. No intake/output data recorded.  FHT:  FHR: 135 bpm, variability: moderate,  accelerations:  Present,  decelerations:  Present early decels with contractions  UC:   regular, every 2-5 minutes SVE:   Dilation: 8 Effacement (%): 80 Station: 0 Exam by:: Cathie Beams, CNM  Labs: Lab Results  Component Value Date   WBC 8.9 04/18/2023   HGB 9.4 (L) 04/18/2023   HCT 30.2 (L) 04/18/2023   MCV 73.3 (L) 04/18/2023   PLT 320 04/18/2023    Assessment / Plan: Spontaneous labor, progressing normally  Labor:  Encouraging frequent position changes and CNM offered hydro therapy as an option for pain management if declining epidural. FOB at bedside and supportive.  Fetal Wellbeing:  Category II Pain Control:  Labor support without medications I/D:   GBS Negative  Anticipated MOD:  NSVD  Claudette Head, CNM 04/19/2023, 9:49 AM

## 2023-04-19 NOTE — Progress Notes (Signed)
 Doing well w/IV pain meds.  Cx now 6/80/-2 per RN.  Ctx q 1.5-5 minutes.  Pt declines any augmentation. FHR Cat 1.  Continue present mgt.

## 2023-04-19 NOTE — Discharge Summary (Signed)
 Postpartum Discharge Summary  Date of Service updated***     Patient Name: Melinda Burch DOB: 1990/12/25 MRN: [id number]  Date of admission: 04/18/2023 Delivery date:04/19/2023 Delivering provider: Carlynn Herald Date of discharge: 04/19/2023  Admitting diagnosis: Indication for care in labor or delivery [O75.9] Intrauterine pregnancy: [redacted]w[redacted]d     Secondary diagnosis:  Principal Problem:   Indication for care in labor or delivery  Additional problems:  Patient Active Problem List   Diagnosis Date Noted   Indication for care in labor or delivery 04/18/2023   Anemia in pregnancy 10/26/2022   Supervision of low-risk pregnancy 10/14/2022   Rubella non-immune status, antepartum 04/28/2017   Sickle cell trait (HCC) 04/05/2017       Discharge diagnosis: {DX.:23714}                                              Post partum procedures:{Postpartum procedures:23558} Augmentation: N/A Complications: None  Hospital course: Onset of Labor With Vaginal Delivery      33 y.o. yo [id number] at [redacted]w[redacted]d was admitted in Latent Labor on 04/18/2023. Labor course was complicated byN/A   Membrane Rupture Time/Date: 5:55 PM,04/18/2023  Delivery Method:Vaginal, Spontaneous Operative Delivery:N/A Episiotomy: None Lacerations:  None Patient had a postpartum course complicated by ***.  She is ambulating, tolerating a regular diet, passing flatus, and urinating well. Patient is discharged home in stable condition on 04/19/23.  Newborn Data: Birth date:04/19/2023 Birth time:10:18 AM Gender:Female Living status:Living Apgars: ,  Weight:   Magnesium Sulfate received: No BMZ received: No Rhophylac:N/A MMR:N/A T-DaP: Declined  Flu: N/A Declined  RSV Vaccine received: No Declined  Transfusion:{Transfusion received:30440034}  Immunizations received: Immunization History  Administered Date(s) Administered   Hepatitis A 12/12/2007   Hepatitis A, Ped/Adol-2 Dose 12/12/2007   Td 10/07/2007   Tdap  07/16/2015   Varicella 10/07/2007    Physical exam  Vitals:   04/19/23 0722 04/19/23 0738 04/19/23 0952 04/19/23 1041  BP:  122/80  (!) 116/90  Pulse: 93 (!) 102  (!) 123  Resp: 18     Temp:   99.1 F (37.3 C)   TempSrc:   Axillary   SpO2:      Weight:      Height:       General: {Exam; general:21111117} Lochia: {Desc; appropriate/inappropriate:30686::"appropriate"} Uterine Fundus: {Desc; firm/soft:30687} Incision: {Exam; incision:21111123} DVT Evaluation: {Exam; dvt:2111122} Labs: Lab Results  Component Value Date   WBC 8.9 04/18/2023   HGB 9.4 (L) 04/18/2023   HCT 30.2 (L) 04/18/2023   MCV 73.3 (L) 04/18/2023   PLT 320 04/18/2023      Latest Ref Rng & Units 04/18/2023    8:22 PM  CMP  Glucose 70 - 99 mg/dL 70   BUN 6 - 20 mg/dL 6   Creatinine 1.47 - 8.29 mg/dL 5.62   Sodium 130 - 865 mmol/L 136   Potassium 3.5 - 5.1 mmol/L 3.7   Chloride 98 - 111 mmol/L 103   CO2 22 - 32 mmol/L 21   Calcium 8.9 - 10.3 mg/dL 9.3   Total Protein 6.5 - 8.1 g/dL 6.7   Total Bilirubin 0.0 - 1.2 mg/dL 0.5   Alkaline Phos 38 - 126 U/L 78   AST 15 - 41 U/L 19   ALT 0 - 44 U/L 9    Edinburgh Score:    07/15/2017    3:05  AM  Edinburgh Postnatal Depression Scale Screening Tool  I have been able to laugh and see the funny side of things. 0  I have looked forward with enjoyment to things. 0  I have blamed myself unnecessarily when things went wrong. 0  I have been anxious or worried for no good reason. 0  I have felt scared or panicky for no good reason. 0  Things have been getting on top of me. 0  I have been so unhappy that I have had difficulty sleeping. 0  I have felt sad or miserable. 0  I have been so unhappy that I have been crying. 0  The thought of harming myself has occurred to me. 0  Edinburgh Postnatal Depression Scale Total 0   No data recorded  After visit meds:  Allergies as of 04/19/2023   No Known Allergies   Med Rec must be completed prior to using this  [facility]***        Discharge home in stable condition Infant Feeding: {Baby feeding:23562} Infant Disposition:{CHL IP OB HOME WITH NWGNFA:21308} Discharge instruction: per After Visit Summary and Postpartum booklet. Activity: Advance as tolerated. Pelvic rest for 6 weeks.  Diet: {OB MVHQ:46962952} Future Appointments: Future Appointments  Date Time Provider Department Center  04/20/2023  8:45 AM [facility] 3 [facility] None  04/21/2023  1:15 PM Carlynn Herald, CNM [facility]  04/29/2023 11:15 AM Sue Lush, FNP [facility]   Follow up Visit:   Please schedule this patient for a Virtual postpartum visit in 6 weeks with the following provider: Any provider. Additional Postpartum F/U: N/A    Low risk pregnancy complicated by:  N/A  Delivery mode:  Vaginal, Spontaneous Anticipated Birth Control:   None    04/19/2023 Claudette Head, CNM  Message sent on 04/19/23

## 2023-04-20 LAB — CBC
HCT: 17 % — ABNORMAL LOW (ref 36.0–46.0)
Hemoglobin: 5.6 g/dL — CL (ref 12.0–15.0)
MCH: 23.7 pg — ABNORMAL LOW (ref 26.0–34.0)
MCHC: 32.9 g/dL (ref 30.0–36.0)
MCV: 72 fL — ABNORMAL LOW (ref 80.0–100.0)
Platelets: 235 10*3/uL (ref 150–400)
RBC: 2.36 MIL/uL — ABNORMAL LOW (ref 3.87–5.11)
RDW: 17.6 % — ABNORMAL HIGH (ref 11.5–15.5)
WBC: 13.6 10*3/uL — ABNORMAL HIGH (ref 4.0–10.5)
nRBC: 0 % (ref 0.0–0.2)

## 2023-04-20 MED ORDER — SODIUM CHLORIDE 0.9 % IV SOLN: INTRAVENOUS | Status: AC | PRN

## 2023-04-20 MED ORDER — DIPHENHYDRAMINE HCL 25 MG PO CAPS: 25.0000 mg | ORAL_CAPSULE | Freq: Once | ORAL | Status: DC

## 2023-04-20 MED ORDER — ALBUTEROL SULFATE (2.5 MG/3ML) 0.083% IN NEBU: 2.5000 mg | INHALATION_SOLUTION | Freq: Once | RESPIRATORY_TRACT | Status: DC | PRN

## 2023-04-20 MED ORDER — SODIUM CHLORIDE 0.9 % IV SOLN
500.0000 mg | Freq: Once | INTRAVENOUS | Status: AC
  Administered 2023-04-20: 500 mg via INTRAVENOUS
  Filled 2023-04-20: qty 25

## 2023-04-20 MED ORDER — DIPHENHYDRAMINE HCL 50 MG/ML IJ SOLN
25.0000 mg | Freq: Once | INTRAMUSCULAR | Status: AC | PRN
  Administered 2023-04-20: 25 mg via INTRAVENOUS
  Filled 2023-04-20: qty 1

## 2023-04-20 MED ORDER — ACETAMINOPHEN 325 MG PO TABS: 650.0000 mg | ORAL_TABLET | Freq: Once | ORAL | Status: DC

## 2023-04-20 MED ORDER — EPINEPHRINE 0.3 MG/0.3ML IJ SOAJ: 0.3000 mg | Freq: Once | INTRAMUSCULAR | Status: DC | PRN

## 2023-04-20 MED ORDER — METHYLPREDNISOLONE SODIUM SUCC 125 MG IJ SOLR: 125.0000 mg | Freq: Once | INTRAMUSCULAR | Status: DC | PRN

## 2023-04-20 MED ORDER — IRON SUCROSE 500 MG IVPB - SIMPLE MED
500.0000 mg | Freq: Once | INTRAVENOUS | Status: DC
  Filled 2023-04-20: qty 275

## 2023-04-20 MED ORDER — SODIUM CHLORIDE 0.9 % IV BOLUS: 500.0000 mL | Freq: Once | INTRAVENOUS | Status: DC | PRN

## 2023-04-20 NOTE — Progress Notes (Signed)
 POSTPARTUM PROGRESS NOTE  Post Partum Day 1  Subjective:  Melinda Burch is a 33 y.o. [id number] s/p SVD at [redacted]w[redacted]d.  She reports she is doing well. Mild headache overnight, now resolved. Fatigued, but denies SOB, weakness or lightheadedness. Discussed critical Hb results of 5.6; recommended blood transfusion. Patient declined. Open to IV Venofer infusion.   Discussed severe anemia may cause weakness, fatigue, low endurance, shortness of breath with activity and difficulties with breast milk production. Patient expressed understanding.   No acute events overnight. She denies any problems with ambulating, voiding or po intake. Denies nausea or vomiting.  Pain is well controlled.  Lochia is appropriate.  Objective: Blood pressure 117/73, pulse 99, temperature 98 F (36.7 C), temperature source Oral, resp. rate 17, height 5\' 4"  (1.626 m), weight 73.3 kg, last menstrual period 07/16/2022, SpO2 99%, unknown if currently breastfeeding.  Physical Exam:  General: alert, cooperative and no distress Chest: no respiratory distress Heart:regular rate, distal pulses intact Uterine Fundus: firm, appropriately tender DVT Evaluation: No calf swelling or tenderness Extremities: trace edema Skin: warm, dry  Recent Labs    04/18/23 2010 04/20/23 0424  HGB 9.4* 5.6*  HCT 30.2* 17.0*    Assessment/Plan: Melinda Burch is a 33 y.o. [id number] s/p SVD at [redacted]w[redacted]d   PPD#1 - Doing well  Routine postpartum care  Severe acute on chronic anemia; complicated by postpartum blood loss & refusal of blood products, mildly symptomatic, clinically significant  IV Venofer today  Continue PO Iron supplementation out patient  Contraception: declined Feeding: breast Dispo: Plan for discharge 3/6.   LOS: 2 days   Wyn Forster, MD OB Fellow  04/20/2023, 5:55 AM

## 2023-04-20 NOTE — Lactation Note (Signed)
 This note was copied from a baby's chart. Lactation Consultation Note  Patient Name: Melinda Burch'X Date: 04/20/2023 Age:31 hours   P3- LC attempted to see [name], but she was asleep at this time. LC requested for her RN to call LC if she goes in the room and [name] is awake.   Dema Severin BS, IBCLC 04/20/2023, 9:37 PM

## 2023-04-20 NOTE — Progress Notes (Addendum)
 Received report from nurse that patient was having some left-sided arm and shoulder pain along with headache. Recently completed IV venofer infusion. Went to bedside to evaluate and patient does have some mild left hand edema, however does not have any weakness or other deficits. No erythema or edema at IV site, no palpable cords. Anticipate spontaneous improvement of swelling but nurse will let me know if worsening occurs. Headache likely 2/2 to anemia as Hg 5.6 this morning. Patient declined blood transfusion and opted for IV iron. Counseled patient that venofer can take up to 2 weeks to improve hemoglobin levels and that recommendation continues to be blood transfusion at this time. Patient will consider and let me know if she would like to proceed. Patient stable at this time.  Montey Ebel L. De Burrs, MD Family Medicine Resident, PGY-1 [phone] 04/20/2023

## 2023-04-20 NOTE — Progress Notes (Signed)
 Pt rubella non-immune. Pt refused MMR vaccine at this time.

## 2023-04-20 NOTE — Lactation Note (Signed)
 This note was copied from a baby's chart. Lactation Consultation Note  Patient Name: Melinda Burch [id number] Date: 04/20/2023 Age:31 hours Reason for consult: Follow-up assessment;Term  P3- RN informed LC that [name] was awake at this time. [name] reports that feedings have been going well. [name] denies having any questions or concerns at this time. LC encouraged [name] to call for further assistance as needed.  Maternal Data Has patient been taught Hand Expression?: Yes Does the patient have breastfeeding experience prior to this delivery?: Yes  Feeding Mother's Current Feeding Choice: Breast Milk  Lactation Tools Discussed/Used Pump Education: Milk Storage  Interventions Interventions: Breast feeding basics reviewed;Education;LC Services brochure  Discharge Discharge Education: Warning signs for feeding baby  Consult Status Consult Status: Follow-up Date: 04/21/23 Follow-up type: In-patient    Dema Severin BS, IBCLC 04/20/2023, 10:51 PM

## 2023-04-21 ENCOUNTER — Encounter: Payer: Medicaid Other | Admitting: Certified Nurse Midwife

## 2023-04-21 LAB — CBC
HCT: 19.2 % — ABNORMAL LOW (ref 36.0–46.0)
Hemoglobin: 6.1 g/dL — CL (ref 12.0–15.0)
MCH: 23 pg — ABNORMAL LOW (ref 26.0–34.0)
MCHC: 31.8 g/dL (ref 30.0–36.0)
MCV: 72.5 fL — ABNORMAL LOW (ref 80.0–100.0)
Platelets: 262 10*3/uL (ref 150–400)
RBC: 2.65 MIL/uL — ABNORMAL LOW (ref 3.87–5.11)
RDW: 17.6 % — ABNORMAL HIGH (ref 11.5–15.5)
WBC: 12.5 10*3/uL — ABNORMAL HIGH (ref 4.0–10.5)
nRBC: 0.2 % (ref 0.0–0.2)

## 2023-04-21 MED ORDER — MEASLES, MUMPS & RUBELLA VAC IJ SOLR: 0.5000 mL | Freq: Once | INTRAMUSCULAR | Status: DC

## 2023-04-21 MED ORDER — ACETAMINOPHEN 325 MG PO TABS: 650.0000 mg | ORAL_TABLET | Freq: Four times a day (QID) | ORAL | 1 refills | Status: AC | PRN

## 2023-04-21 MED ORDER — IBUPROFEN 600 MG PO TABS: 600.0000 mg | ORAL_TABLET | Freq: Four times a day (QID) | ORAL | 1 refills | Status: AC | PRN

## 2023-04-21 MED ORDER — SENNOSIDES-DOCUSATE SODIUM 8.6-50 MG PO TABS: 2.0000 | ORAL_TABLET | Freq: Every evening | ORAL | 1 refills | Status: AC | PRN

## 2023-04-21 MED ORDER — FERROUS SULFATE 325 (65 FE) MG PO TBEC: 325.0000 mg | DELAYED_RELEASE_TABLET | ORAL | 0 refills | Status: AC

## 2023-04-21 NOTE — Discharge Instructions (Signed)
 If you want to connect with Outpatient Lactation at [facility], please call [phone] and select option 4 for lactation.

## 2023-04-21 NOTE — Lactation Note (Signed)
 This note was copied from a baby's chart. Lactation Consultation Note  Patient Name: Melinda Burch [id number] Date: 04/21/2023 Age:31 hours Reason for consult: Follow-up assessment;Infant weight loss;Term (6 %  weight loss) As LC entered the room the baby was feeding on the right breast with depth and swallows noted. Per mom comfortable.  LC updated this feeding.  LC reviewed the BF D/C teaching and the [facility] resources. See below for details.    Maternal Data Has patient been taught Hand Expression?: Yes Does the patient have breastfeeding experience prior to this delivery?: Yes  Feeding Mother's Current Feeding Choice: Breast Milk  LATCH Score Latch:  (latrched on the right breast with depth)  Audible Swallowing:  (swallows noted)  Comfort (Breast/Nipple):  (per mom comfortable)  Hold (Positioning):  (mom had already latched the baby)    Lactation Tools Discussed/Used Tools: Pump;Flanges Flange Size: 24;27;30 Breast pump type: Manual Pump Education: Milk Storage;Setup, frequency, and cleaning Reason for Pumping: PRN  Interventions Interventions: Breast feeding basics reviewed;Hand pump;Education;CDC Guidelines for Breast Pump Cleaning;CDC milk storage guidelines;LC Services brochure;Reverse pressure  Discharge Discharge Education: Engorgement and breast care;Warning signs for feeding baby Pump: Personal;Manual;Hands Free  Consult Status Consult Status: Complete Date: 04/21/23    Kathrin Greathouse 04/21/2023, 8:43 AM

## 2023-04-21 NOTE — MAU Provider Note (Signed)
Please refer to H & P for admission details.    UGONNA  ANYANWU, MD, FACOG Attending Obstetrician & Gynecologist Faculty Practice, [facility]   

## 2023-04-27 ENCOUNTER — Telehealth (HOSPITAL_COMMUNITY): Payer: Self-pay | Admitting: *Deleted

## 2023-04-27 NOTE — Telephone Encounter (Signed)
 04/27/2023  Name: Melinda Burch MRN: [id number] DOB: 1990-11-04  Reason for Call:  Transition of Care Hospital Discharge Call  Contact Status: Patient Contact Status: Complete  Language assistant needed: Interpreter Mode: Interpreter Not Needed        Follow-Up Questions: Do You Have Any Concerns About Your Health As You Heal From Delivery?: No Do You Have Any Concerns About Your Infants Health?: No  Edinburgh Postnatal Depression Scale:  In the Past 7 Days:    PHQ2-9 Depression Scale:     Discharge Follow-up: Edinburgh score requires follow up?:  (Declines screening today.  Says there are no changes to her answers from the hospital when her screening score was "0".  Endorses she is doing well emotionally.) Patient was advised of the following resources:: Support Group, Breastfeeding Support Group  Post-discharge interventions: Reviewed Newborn Safe Sleep Practices  Salena Saner, RN 04/27/2023 15:22

## 2023-04-29 ENCOUNTER — Encounter: Payer: Medicaid Other | Admitting: Obstetrics and Gynecology

## 2023-05-30 ENCOUNTER — Encounter: Payer: Self-pay | Admitting: Physician Assistant

## 2023-05-30 ENCOUNTER — Telehealth: Admitting: Physician Assistant

## 2023-05-30 NOTE — Progress Notes (Signed)
   Provider location: [facility] for [facility] [employer] [facility]   Patient location: Home  I met with Melinda Burch on 05/30/23 at  3:35 PM EDT by Mychart Video Encounter and verified that I am speaking with the correct person using two identifiers.       I discussed the limitations, risks, security and privacy concerns of performing an evaluation and management service virtually and the availability of in person appointments. I also discussed with the patient that there may be a patient responsible charge related to this service. The patient expressed understanding and agreed to proceed.  Post Partum Visit Note Subjective:   Melinda Burch is a 33 y.o. [phone] female who presents for a postpartum visit. She is 5 week postpartum following a normal spontaneous vaginal delivery.  I have fully reviewed the prenatal and intrapartum course. The delivery was at 39/4 gestational weeks.  Anesthesia: IV sedation. Postpartum course has been going well. Baby is doing well. Baby is feeding by breast. Bleeding thin lochia. Bowel function is  constipated . Bladder function is normal. Patient is not sexually active. Contraception method is none. Postpartum depression screening: negative.  The pregnancy intention screening data noted above was reviewed. Potential methods of contraception were discussed. The patient elected to proceed with No data recorded.  The following portions of the patient's history were reviewed and updated as appropriate: allergies, current medications, past family history, past medical history, past social history, past surgical history, and problem list.  Review of Systems Pertinent items noted in HPI and remainder of comprehensive ROS otherwise negative.  Objective:  LMP 07/16/2022 (Exact Date)     General:  Alert, oriented and cooperative. Patient is in no acute distress.  Respiratory: Normal respiratory effort, no problems with respiration noted  Mental Status: Normal  mood and affect. Normal behavior. Normal judgment and thought content.  Rest of physical exam deferred due to type of encounter   Assessment:    Routine, normal postpartum exam.  Plan:  Essential components of care per ACOG recommendations:  1.  Mood and well being: Patient with negative depression screening today. Reviewed local resources for support.  - Patient does not use tobacco.  - hx of drug use? No    2. Infant care and feeding:  -Patient currently breastmilk feeding? Yes -Social determinants of health (SDOH) reviewed in EPIC.  3. Sexuality, contraception and birth spacing - Patient does not want a pregnancy in the next year.  Desired family size is 3 children.  - Reviewed forms of contraception in tiered fashion. Patient desired no method today.    4. Sleep and fatigue -Encouraged family/partner/community support of 4 hrs of uninterrupted sleep to help with mood and fatigue  5. Physical Recovery  - Discussed patients delivery and complications - Patient had no lacerations - Patient has urinary incontinence? No - Patient is safe to resume physical and sexual activity  6.  Health Maintenance - Last pap smear done 10/22/2022 and was normal with negative HPV  7. Chronic Diseases: None  - PCP follow up  No follow-ups on file.  No future appointments.  Pati Bonine, CMA Center for [employer], [facility]

## 2023-05-30 NOTE — Progress Notes (Signed)
 Return to work letter sent via My Chart today.

## 2023-08-06 ENCOUNTER — Emergency Department (HOSPITAL_COMMUNITY)
Admission: EM | Admit: 2023-08-06 | Discharge: 2023-08-07 | Disposition: A | Discharge status: Home / Self Care | Attending: Emergency Medicine | Admitting: Emergency Medicine

## 2023-08-06 ENCOUNTER — Encounter (HOSPITAL_COMMUNITY): Payer: Self-pay

## 2023-08-06 ENCOUNTER — Other Ambulatory Visit: Payer: Self-pay

## 2023-08-06 DIAGNOSIS — H9201 Otalgia, right ear: Secondary | ICD-10-CM | POA: Diagnosis present

## 2023-08-06 DIAGNOSIS — H6121 Impacted cerumen, right ear: Secondary | ICD-10-CM | POA: Insufficient documentation

## 2023-08-06 NOTE — ED Provider Notes (Signed)
 [facility] AT [facility] Provider Note  CSN: [id number] Arrival date & time: 08/06/23 2230  Chief Complaint(s) Otalgia  HPI Melinda Burch is a 33 y.o. female here for pressure of the right ear with decreased hearing. Feels like she has wax build up.  No actual pain.  No fevers or chills.  No cough or congestion.   Otalgia   Past Medical History Past Medical History:  Diagnosis Date   Anemia    Anemia affecting pregnancy in third trimester 05/30/2017   HGB 9.3 at 32 weeks     Infection    UTI/ Trich   Normal labor 07/15/2017   Rubella non-immune status, antepartum 04/28/2017   Sickle cell trait (HCC) 04/05/2017   Supervision of other normal pregnancy, antepartum 04/05/2017            Clinic     [facility]    Prenatal Labs      Dating     BY LMP confirmed by anatomy US  at 25 w 6d    Blood type:A positive         Genetic Screen    1 Screen:    AFP:     Quad:     NIPS:    Antibody: negative      Anatomic US      04-11-17 Normal  27w 4d by LMP is BEDC    Rubella:  NON IMMUNE      GTT    Early:               Third trimester:  1 hour was 93    RPR:   NR      Flu vaccine         HBsA   Trichomoniasis 07/07/2017   2 grams flagyl  given in MAU on 5-12;  POC 07/15/17 still + (pt states hasn't had sex since treatment):  IV flagyl  given 07/15/17 and rx for BID X 7 days sent to pharmacy     Patient Active Problem List   Diagnosis Date Noted   SVD (spontaneous vaginal delivery) 04/18/2023   Anemia in pregnancy 10/26/2022   Rubella non-immune status, antepartum 04/28/2017   Sickle cell trait (HCC) 04/05/2017   Home Medication(s) Prior to Admission medications   Medication Sig Start Date End Date Taking? Authorizing Provider  acetaminophen  (TYLENOL ) 325 MG tablet Take 2 tablets (650 mg total) by mouth every 6 (six) hours as needed (for pain scale < 4). 04/21/23   Nicholaus Almarie HERO, MD  ferrous sulfate  325 (65 FE) MG EC tablet Take 1 tablet (325 mg total) by mouth every  other day. 04/21/23 04/20/24  Nicholaus Almarie HERO, MD  ibuprofen  (ADVIL ) 600 MG tablet Take 1 tablet (600 mg total) by mouth every 6 (six) hours as needed. 04/21/23   Nicholaus Almarie HERO, MD  Prenatal Vit-Fe Fumarate-FA (MULTIVITAMIN-PRENATAL) 27-0.8 MG TABS tablet Take 1 tablet by mouth daily at 12 noon.    [provider]  senna-docusate (SENOKOT-S) 8.6-50 MG tablet Take 2 tablets by mouth at bedtime as needed for mild constipation. 04/21/23   Nicholaus Almarie HERO, MD  Allergies Patient has no known allergies.  Review of Systems Review of Systems  HENT:  Positive for ear pain.    As noted in HPI  Physical Exam Vital Signs  I have reviewed the triage vital signs BP 118/78 (BP Location: Left Arm)   Pulse 94   Temp 97.9 F (36.6 C) (Oral)   Resp 18   SpO2 98%   Physical Exam Vitals reviewed.  Constitutional:      General: She is not in acute distress.    Appearance: She is well-developed. She is not diaphoretic.  HENT:     Head: Normocephalic and atraumatic.     Right Ear: External ear normal. No tenderness. There is impacted cerumen. No mastoid tenderness.     Left Ear: External ear normal. No mastoid tenderness. Tympanic membrane is not injected or erythematous.     Nose: Nose normal.   Eyes:     General: No scleral icterus.    Conjunctiva/sclera: Conjunctivae normal.   Neck:     Trachea: Phonation normal.   Cardiovascular:     Rate and Rhythm: Normal rate and regular rhythm.  Pulmonary:     Effort: Pulmonary effort is normal. No respiratory distress.     Breath sounds: No stridor.  Abdominal:     General: There is no distension.   Musculoskeletal:        General: Normal range of motion.     Cervical back: Normal range of motion.   Neurological:     Mental Status: She is alert and oriented to person, place, and time.   Psychiatric:         Behavior: Behavior normal.     ED Results and Treatments Labs (all labs ordered are listed, but only abnormal results are displayed) Labs Reviewed - No data to display                                                                                                                       EKG  EKG Interpretation Date/Time:    Ventricular Rate:    PR Interval:    QRS Duration:    QT Interval:    QTC Calculation:   R Axis:      Text Interpretation:         Radiology No results found.  Medications Ordered in ED Medications - No data to display Procedures Procedures  (including critical care time) Medical Decision Making / ED Course   Medical Decision Making   Cerumen impaction of right ear. No infectious symptoms concerning for infection. OTC irrigation recommended. Pcp f/u as needed.    Final Clinical Impression(s) / ED Diagnoses Final diagnoses:  Impacted cerumen of right ear  The patient appears reasonably screened and/or stabilized for discharge and I doubt any other medical condition or other [facility] requiring further screening, evaluation, or treatment in the ED at this time. I have discussed the findings, Dx and Tx plan with the patient/family who expressed understanding and agree(s) with the plan.  Discharge instructions discussed at length. The patient/family was given strict return precautions who verbalized understanding of the instructions. No further questions at time of discharge.  Disposition: Discharge  Condition: Good  ED Discharge Orders     None        Follow Up: Primary care provider  Call  to schedule an appointment for close follow up     This chart was dictated using voice recognition software.  Despite best efforts to proofread,  errors can occur which can change the documentation meaning.    Trine Raynell Moder, MD 08/06/23 [phone]

## 2023-08-06 NOTE — ED Triage Notes (Signed)
 Pt is coming in with right sided ear pain that she woke up with this morning, she feels like she either has an ear infection or a build up of ear wax. She has not taken any medications for pain prior to arrival, she is also breast feeding her child as well. She is otherwise stable at this time with some secondary complaints of a headache that she attributes to the pressure/pain she has in her ear.

## 2024-01-14 ENCOUNTER — Emergency Department (HOSPITAL_COMMUNITY)

## 2024-01-14 ENCOUNTER — Emergency Department (HOSPITAL_COMMUNITY)
Admission: EM | Admit: 2024-01-14 | Discharge: 2024-01-14 | Disposition: A | Discharge status: Home / Self Care | Attending: Emergency Medicine | Admitting: Emergency Medicine

## 2024-01-14 ENCOUNTER — Other Ambulatory Visit: Payer: Self-pay

## 2024-01-14 ENCOUNTER — Encounter (HOSPITAL_COMMUNITY): Payer: Self-pay | Admitting: *Deleted

## 2024-01-14 DIAGNOSIS — R09A2 Foreign body sensation, throat: Secondary | ICD-10-CM | POA: Insufficient documentation

## 2024-01-14 LAB — CBC WITH DIFFERENTIAL/PLATELET
Abs Immature Granulocytes: 0.01 K/uL (ref 0.00–0.07)
Basophils Absolute: 0.1 K/uL (ref 0.0–0.1)
Basophils Relative: 1 %
Eosinophils Absolute: 0.1 K/uL (ref 0.0–0.5)
Eosinophils Relative: 2 %
HCT: 40.8 % (ref 36.0–46.0)
Hemoglobin: 13.8 g/dL (ref 12.0–15.0)
Immature Granulocytes: 0 %
Lymphocytes Relative: 25 %
Lymphs Abs: 1.5 K/uL (ref 0.7–4.0)
MCH: 29.2 pg (ref 26.0–34.0)
MCHC: 33.8 g/dL (ref 30.0–36.0)
MCV: 86.4 fL (ref 80.0–100.0)
Monocytes Absolute: 0.3 K/uL (ref 0.1–1.0)
Monocytes Relative: 5 %
Neutro Abs: 4 K/uL (ref 1.7–7.7)
Neutrophils Relative %: 67 %
Platelets: 304 K/uL (ref 150–400)
RBC: 4.72 MIL/uL (ref 3.87–5.11)
RDW: 13.3 % (ref 11.5–15.5)
WBC: 6 K/uL (ref 4.0–10.5)
nRBC: 0 % (ref 0.0–0.2)

## 2024-01-14 LAB — I-STAT CHEM 8, ED
BUN: 11 mg/dL (ref 6–20)
Calcium, Ion: 1.09 mmol/L — ABNORMAL LOW (ref 1.15–1.40)
Chloride: 101 mmol/L (ref 98–111)
Creatinine, Ser: 0.8 mg/dL (ref 0.44–1.00)
Glucose, Bld: 73 mg/dL (ref 70–99)
HCT: 43 % (ref 36.0–46.0)
Hemoglobin: 14.6 g/dL (ref 12.0–15.0)
Potassium: 4.2 mmol/L (ref 3.5–5.1)
Sodium: 137 mmol/L (ref 135–145)
TCO2: 25 mmol/L (ref 22–32)

## 2024-01-14 LAB — HCG, SERUM, QUALITATIVE: Preg, Serum: NEGATIVE

## 2024-01-14 MED ORDER — FAMOTIDINE 20 MG PO TABS: 20.0000 mg | ORAL_TABLET | Freq: Two times a day (BID) | ORAL | 0 refills | Status: AC

## 2024-01-14 MED ORDER — IOHEXOL 350 MG/ML SOLN
75.0000 mL | Freq: Once | INTRAVENOUS | Status: AC | PRN
  Administered 2024-01-14: 75 mL via INTRAVENOUS

## 2024-01-14 NOTE — ED Provider Notes (Cosign Needed Addendum)
 Received patient in signout from previous provider pending CT soft tissue.  See their note.  In short, patient presents Emergency Department for evaluation of foreign body sensation in upper throat that started 3 days ago  Physical Exam  BP 120/80 (BP Location: Right Arm)   Pulse 93   Temp 97.7 F (36.5 C)   Resp 17   Ht 5' 4 (1.626 m)   Wt 67.6 kg   SpO2 100%   BMI 25.58 kg/m   Physical Exam Vitals and nursing note reviewed.  Constitutional:      General: She is not in acute distress.    Appearance: Normal appearance.  HENT:     Head: Normocephalic and atraumatic.     Mouth/Throat:     Comments: Speaking in full complete sentences.  Swallowing without difficulty.  Actively eating crackers on my exam.  No drooling. Eyes:     Conjunctiva/sclera: Conjunctivae normal.  Cardiovascular:     Rate and Rhythm: Normal rate.  Pulmonary:     Effort: Pulmonary effort is normal. No respiratory distress.     Comments: Maintaining oxygen saturation without supplementation.  No signs of respiratory distress. Skin:    Coloration: Skin is not jaundiced or pale.  Neurological:     Mental Status: She is alert. Mental status is at baseline.     ED Course / MDM    Medical Decision Making Amount and/or Complexity of Data Reviewed Labs: ordered. Radiology: ordered.  Risk Prescription drug management.   I was told by nursing that patient wanted to leave prior to obtaining CT imaging.  Although I recommended patient to stay for CT imaging if you she is feeling a foreign body sensation or lump in her throat, she wishes to follow-up with GI for an endoscopy for further management as she is worried about CT radiation.  As I was completing note, patient then changed her mind and wish to stay for CT imaging.  CT imaging obtained and noted left thyroid nodule and nonemergent follow-up but otherwise no food bolus or mass or pathology blocking throat to explain food bolus sensation.  Passes p.o.  challenge.  No vomiting while in ED.  I did provide her with gastroenterology follow-up for further management, possible endoscopy. Provided her with pepcid  for reflux symptoms  Discussed ED workup, disposition, return to ED precautions with patient who expresses understanding agrees with plan.  All questions answered to their satisfaction.  They are agreeable to plan.  Discharge instructions provided on paperwork    Minnie Tinnie BRAVO, PA 01/14/24 1634    Dasie Faden, MD 01/17/24 [phone]

## 2024-01-14 NOTE — Discharge Instructions (Addendum)
 Thank you for letting us  evaluate you today.  Your CT imaging did not show any masses or anything obstructing your esophagus.  I have provided you with Pepcid  for reflux to see if this improves symptoms.  Follow up with GI specialist for further management.  I provided you with a number to call to schedule an appointment  Return to ED if you experience intractable vomiting/ unable to keep fluids down, chest pain, shortness of breath

## 2024-01-14 NOTE — ED Provider Notes (Signed)
 [facility] Provider Note   CSN: [id number] Arrival date & time: 01/14/24  1234     Patient presents with: Food Bolus   Melinda Burch is a 33 y.o. female with PMHx anemia who presents to ED concerned for foreign body sensation in upper throat. Patient stating that this started around 3 days ago. Patient stating that she has been fasting and mostly eating liquids over the past couple of days, but feels like sometimes the food gets caught on something just below her posterior oropharyngeal area and comes back up into her throat. Patient states that it feels like there is a mass in this area. Denies recent fever, cough, rhinorrhea, congestion, SOB. Denies nausea, vomiting, diarrhea. Patient also sometimes feels a burning lower in her chest when she swallows.   HPI     Prior to Admission medications   Medication Sig Start Date End Date Taking? Authorizing Provider  acetaminophen  (TYLENOL ) 325 MG tablet Take 2 tablets (650 mg total) by mouth every 6 (six) hours as needed (for pain scale < 4). 04/21/23   Nicholaus Almarie HERO, MD  ferrous sulfate  325 (65 FE) MG EC tablet Take 1 tablet (325 mg total) by mouth every other day. 04/21/23 04/20/24  Nicholaus Almarie HERO, MD  ibuprofen  (ADVIL ) 600 MG tablet Take 1 tablet (600 mg total) by mouth every 6 (six) hours as needed. 04/21/23   Nicholaus Almarie HERO, MD  Prenatal Vit-Fe Fumarate-FA (MULTIVITAMIN-PRENATAL) 27-0.8 MG TABS tablet Take 1 tablet by mouth daily at 12 noon.    [provider]  senna-docusate (SENOKOT-S) 8.6-50 MG tablet Take 2 tablets by mouth at bedtime as needed for mild constipation. 04/21/23   Nicholaus Almarie HERO, MD    Allergies: Patient has no known allergies.    Review of Systems  HENT:  Positive for trouble swallowing.     Updated Vital Signs BP 120/80 (BP Location: Right Arm)   Pulse 93   Temp 97.7 F (36.5 C)   Resp 17   Ht 5' 4 (1.626 m)   Wt 67.6 kg   SpO2 100%   BMI  25.58 kg/m   Physical Exam Vitals and nursing note reviewed.  Constitutional:      General: She is not in acute distress.    Appearance: She is not ill-appearing or toxic-appearing.  HENT:     Head: Normocephalic and atraumatic.     Mouth/Throat:     Mouth: Mucous membranes are moist.     Pharynx: Oropharynx is clear. Uvula midline. No pharyngeal swelling, oropharyngeal exudate or posterior oropharyngeal erythema.     Tonsils: 1+ on the right. 1+ on the left.     Comments: No oropharyngeal swelling, erythema, or exudates appreciated. No trismus. No muffled voice or voice changes.  Eyes:     General: No scleral icterus.       Right eye: No discharge.        Left eye: No discharge.     Conjunctiva/sclera: Conjunctivae normal.  Cardiovascular:     Rate and Rhythm: Normal rate.     Pulses: Normal pulses.     Heart sounds: No murmur heard. Pulmonary:     Effort: Pulmonary effort is normal. No respiratory distress.     Breath sounds: No wheezing, rhonchi or rales.  Abdominal:     Tenderness: There is no abdominal tenderness.  Musculoskeletal:     Right lower leg: No edema.     Left lower leg: No edema.  Skin:    General: Skin is warm and dry.     Findings: No rash.  Neurological:     General: No focal deficit present.     Mental Status: She is alert. Mental status is at baseline.  Psychiatric:        Mood and Affect: Mood normal.     (all labs ordered are listed, but only abnormal results are displayed) Labs Reviewed  CBC WITH DIFFERENTIAL/PLATELET  HCG, SERUM, QUALITATIVE  I-STAT CHEM 8, ED    EKG: None  Radiology: No results found.   Procedures   Medications Ordered in the ED - No data to display                                  Medical Decision Making Amount and/or Complexity of Data Reviewed Labs: ordered. Radiology: ordered.   This patient presents to the ED for concern of foreign body in throat, this involves an extensive number of treatment  options, and is a complaint that carries with it a high risk of complications and morbidity.  The differential diagnosis includes foreign body, abscess/deep space infection, etc.   Co morbidities that complicate the patient evaluation  Anemia   Additional history obtained:  No PCP listed in chart    Problem List / ED Course / Critical interventions / Medication management  Patient presents ED concern for foreign body sensation in the upper part of her throat.  Physical exam reassuring.  Patient afebrile with stable vitals.  Patient is able to tolerate PO well but intermittently feels that the food is pushed back up into her mouth when she swallows. I Ordered, and personally interpreted labs.  CBC without leukocytosis or anemia.  Chem-8 reassuring.  I ordered imaging studies including CT soft tissue neck.  This imaging result is pending  I have reviewed the patients home medicines and have made adjustments as needed   Social Determinants of Health:  none   3PM Care of Shenise Dwan transferred to [facility] at the end of my shift as the patient will require reassessment once labs/imaging have resulted. Patient presentation, ED course, and plan of care discussed with review of all pertinent labs and imaging. Please see his/her note for further details regarding further ED course and disposition. Plan at time of handoff is reassess patient after imaging results. This may be altered or completely changed at the discretion of the oncoming team pending results of further workup.      Final diagnoses:  None    ED Discharge Orders     None          Hoy Nidia FALCON, [place] 01/14/24 1510    Ellouise Richerd POUR, [place] 01/14/24 [phone]

## 2024-01-14 NOTE — ED Triage Notes (Signed)
 Pt reports she thinks she has a food bolus x3 days.  NAD noted.  Pt easily speaking full sentences.

## 2024-01-14 NOTE — ED Notes (Signed)
 States she is able to drink liquids and they stay down
